# Patient Record
Sex: Female | Born: 1965 | Hispanic: Yes | Marital: Single
Health system: Midwestern US, Community
[De-identification: ages and names within clinical notes are randomized; demographics above are authoritative.]

## PROBLEM LIST (undated history)

## (undated) DIAGNOSIS — R011 Cardiac murmur, unspecified: Secondary | ICD-10-CM

## (undated) DIAGNOSIS — Z20822 Contact with and (suspected) exposure to covid-19: Secondary | ICD-10-CM

## (undated) DIAGNOSIS — I10 Essential (primary) hypertension: Secondary | ICD-10-CM

## (undated) DIAGNOSIS — Z86711 Personal history of pulmonary embolism: Secondary | ICD-10-CM

---

## 2012-03-15 NOTE — ED Provider Notes (Signed)
The history is provided by the patient.    9:59 PM Samantha Nixon is a 47 y.o. female with a h/o arthritis, who presents to ED c/o L knee pain, specifically behind the knee that has become progressively worse. Pt describes the pain as constant and states she feels a pop when she stands up. Pt states she took Aleve with minimal relief. Pt states she has felt this pain before last year. She states she was leaning on her L knee a few days ago that she believes might have exacerbated the pain. Pt reports she did fall 6 months ago but denies any other injuries, recent long travels, recent new medications, weakness, numbness, or any other complaints at this time. She saw an orthopedic surgeon for similar pain in this knee 2 years ago and was told that she has arthritis  Of the knee.  No fever, no redness of the knee.    PCP:  None        History reviewed. No pertinent past medical history.     History reviewed. No pertinent past surgical history.      History reviewed. No pertinent family history.     History     Social History   ??? Marital Status: SINGLE     Spouse Name: N/A     Number of Children: N/A   ??? Years of Education: N/A     Occupational History   ??? Not on file.     Social History Main Topics   ??? Smoking status: Never Smoker    ??? Smokeless tobacco: Not on file   ??? Alcohol Use: Not on file   ??? Drug Use: Not on file   ??? Sexually Active: Not on file     Other Topics Concern   ??? Not on file     Social History Narrative   ??? No narrative on file        ALLERGIES: Shellfish derived      Review of Systems   Constitutional: Negative for fever and chills.   HENT: Negative for neck pain and neck stiffness.    Respiratory: Negative for cough and shortness of breath.    Cardiovascular: Negative for chest pain and leg swelling.   Gastrointestinal: Negative for nausea and abdominal pain.   Genitourinary: Negative for dysuria and hematuria.   Musculoskeletal: Positive for arthralgias (L knee). Negative for myalgias and back pain.    Skin: Negative for color change and rash.   Neurological: Negative for light-headedness and headaches.       Filed Vitals:    03/15/12 2116 03/15/12 2133   BP: 150/72    Pulse: 65    Temp: 98.1 ??F (36.7 ??C)    Resp: 18    Height: 5\' 5"  (1.651 m)    Weight: 104.327 kg (230 lb)    SpO2: 100% 99%   9:59 PM Pulse Oximetry reading is 100 % on room air, which indicates normal oxygenation per Teola Bradley, MD          Physical Exam   Nursing note and vitals reviewed.  Constitutional: She is oriented to person, place, and time. She appears well-developed and well-nourished.   HENT:   Head: Normocephalic and atraumatic.   Eyes: Conjunctivae are normal. No scleral icterus.   Neck: Normal range of motion. Neck supple.   Cardiovascular: Normal rate, regular rhythm and normal heart sounds.    Pulmonary/Chest: Effort normal. No respiratory distress.   Abdominal: Soft. Bowel sounds are normal. She  exhibits no distension. There is no tenderness.   Musculoskeletal: Normal range of motion. She exhibits no edema.        Left knee: She exhibits swelling (mild). She exhibits normal range of motion ( can bend it 90 degrees) and no deformity. Tenderness ( diffuse, more so around the posterior ) found.   Neurological: She is alert and oriented to person, place, and time.   Skin: Skin is warm and dry.        MDM     Amount and/or Complexity of Data Reviewed:    Review and summarize past medical records:  Yes      Procedures     <EMERGENCY DEPARTMENT CASE SUMMARY>    Plan: Naproxen     ED Course: Pt presents to ED c/o L knee pain. Pt given Naproxen in the ED.     Re-evaluations:   10:20 PM Patient rechecked.  D/w pt test results, dx, tx plan, and need for follow-up. RTER warnings given. All questions answered, and the pt agrees with the plan.  RICE recommendations explained. ACE bandage applied. Rx: naproxen BID PRN.      Final Impression/Diagnosis:   1. Knee pain        Patient condition at time of disposition: stable      I have  reviewed the following home medications:    Prior to Admission medications    Medication Sig Start Date End Date Taking? Authorizing Provider   naproxen (NAPROSYN) 500 mg tablet Take 1 Tab by mouth every twelve (12) hours as needed for Pain. Take with food. 03/15/12  Yes Teola Bradley, MD       Teola Bradley, MD     I, Patience Musca, am serving as a scribe to document services personally performed by Teola Bradley, MD based on my observation and the provider's statements to me.      ITeola Bradley, MD attest that the person(s) noted above, acting as my scribe(s) noted above, has observed my performance of the services and has documented them in accordance with my direction. I have personally reviewed the above information and have ordered and reviewed the diagnostic studies, unless otherwise noted.

## 2012-03-15 NOTE — ED Notes (Signed)
Constant pain to back of left knee. Pt states when getting up, hears and feels a pop to the front of left knee. OTC meds not helping. Denies recent injury, fell six months ago.

## 2012-03-15 NOTE — ED Notes (Signed)
Patient presents to ED with complaints of left leg pain.  Pain behind knee.  Patient has taken aleve prior to arrival.  Had experience with this type of pain last year.  States diagnosed as arthritis in the past year.

## 2013-05-08 NOTE — Progress Notes (Signed)
05/08/2013     Allene DillonMeir Malmazada, MD   [address]  [place] [id number]        Patient:  Samantha MediciRosa Borunda   DOB:  1965-05-26       CHIEF COMPLAINT:   Chief Complaint   Patient presents with   ??? Shortness of Breath   ??? Palpitations   ??? Fatigue       Dear Dr. Marney DoctorMalmazada,    HISTORY OF PRESENT ILLNESS: Today I had the pleasure of seeing your patient, Samantha Nixon in cardiology consultation. As you know, she is a 48 y.o. year old female who  has no past medical history on file. presenting for cardiac evaluation. Several symptoms.    Intermittent palpitations x yrs. Happens about 2x/wk. Rapid/regular. Last minutes. Either resolve spontaneously or with a hard cough.    Significant generalized fatigue recently.    Isolated episode of CP 1 wk ago. Woke patient from sleep. Mid chest heaviness, nonradiating, lasted hours. Never happened before or since.    DOE mild and unchanged x yrs -- patient attributes to weight.     Intermittent numbness and parasthesias in fingers. New in the past few months. Occurs mainly at night while lying in bed. Improves in <1 min by "shaking out" her arms.    Husband has noted recent snoring; no apneic spells observed.    PAST MEDICAL HISTORY:  History reviewed. No pertinent past medical history.     PAST SURGICAL HISTORY:  Past Surgical History   Procedure Laterality Date   ??? Hx breast reduction     ??? Hx cholecystectomy         MEDICATIONS:  Current Outpatient Prescriptions   Medication Sig Dispense Refill   ??? cholecalciferol, vitamin D3, (VITAMIN D3) 2,000 unit tab Take  by mouth.       ??? FERROUS GLUCONATE/VIT C/FA (IRON-C PO) Take  by mouth.       ??? multivitamin (ONE A DAY) tablet Take 1 Tab by mouth daily.            ALLERGIES:   Allergies   Allergen Reactions   ??? Shellfish Derived Anaphylaxis       SOCIAL HISTORY:  History     Social History   ??? Marital Status: UNKNOWN     Spouse Name: N/A     Number of Children: N/A   ??? Years of Education: N/A     Social History Main Topics   ???  Smoking status: Never Smoker    ??? Smokeless tobacco: Not on file   ??? Alcohol Use: No   ??? Drug Use: No   ??? Sexual Activity: Not on file     Other Topics Concern   ??? Not on file     Social History Narrative        FAMILY HISTORY:  Family History   Problem Relation Age of Onset   ??? Hypertension Mother    ??? Heart Disease Father      MI in 4170's       REVIEW OF SYSTEMS:  Neuro: numbness, parasthesias  Eyes: No visual disturbance  ENMT: No soar throat, nasal congestion, hearing loss, tinnitus  CV: chest pain, palpitations   Resp: dyspnea  GI: No abdominal pain, N/V, diarrhea, constipation  GU: No hematuria, dysuria  Skin: No rash  Heme: No bleeding, bruising  Constitutional: fatigue  Musculoskeletal: No myalgias, arthralgias  Other:    PHYSICAL EXAM:  Blood pressure 150/94, pulse 72,  height 5\' 4"  (1.626 m), weight 248 lb (112.492 kg), SpO2 98 %.   Body mass index is 42.55 kg/(m^2).  General: Well developed, well nourished, no acute distress, appears stated age  Eyes: Anicteric, no hemorrhage, normal appearing conjunctivae and lids  ENMT: Normal externally  Neck: Supple, no JVD, no carotid bruit  CV: RRR, normal S1S2, no M/G/R  Lungs: CTA bilaterally, normal respiratory excursion  Abd: Soft, NT, ND, no palpable mass  Extremities: No pedal edema  Skin: Warm and dry  Musculoskeletal: Normal muscle tone, normal gait  Neuro: Nonfocal exam, no rigidity/tremor  Psych: Alert and oriented, appropriate affect    LABS AND TESTING:  ?? EKG: 03/25/13 - Sinus rhythm, normal axis/intervals, no significant ST/T abnormalities.   ?? Labs: 04/23/13 - CBC nl  ?? Labs 04/05/13 - CBC nl, CMP nl, TG 89, HDL 45, LDL 149, TSH nl      ASSESSMENT AND PLAN:    1. Chest pain, SOB - The described chest pain is atypical for a cardiac etiology, however, the patient has multiple cardiovascular risk factors. Her SOB also has a broad differential diagnosis (including obesity/deconditioning). Labs noted as above. Will check an echocardiogram to evaluate for any  structural heart disease and a stress test to evaluate for myocardial ischemia.   2. Palpitations - Etiology unclear. Labs noted. Will arrange for an echo and 24 hr holter monitor. If this is unrevealing, will likely need a longer term event monitor -- will followup.  3. Numbness, parasthesias - Doubt vascular anomaly -- patient with good pulses. Possibly due to peripheral neuropathy vs sleeping position. Observe for now and consider neuro eval if worsening.  4. Snoring, fatigue - Would arrange for sleep study. Will hold off for now until the above testing is complete.  5. Obesity - Has failed multiple diets and exercise regimens x yrs. I brought up the possibility of bariatric surgery. Pending the above planned test results, will give her a referral.        Thank you very much for this consultation, I will keep you updated with the results of the above testing.    Sincerely,    Cranford Mon, MD

## 2013-05-08 NOTE — Patient Instructions (Signed)
MyChart Activation    Thank you for requesting access to MyChart. Please follow the instructions below to securely access and download your online medical record. MyChart allows you to send messages to your doctor, view your test results, renew your prescriptions, schedule appointments, and more.    How Do I Sign Up?    1. In your internet browser, go to [email or web address]  2. Click on the First Time User? Click Here link in the Sign In box. You will be redirect to the New Member Sign Up page.  3. Enter your MyChart Access Code exactly as it appears below. You will not need to use this code after you???ve completed the sign-up process. If you do not sign up before the expiration date, you must request a new code.    MyChart Access Code: [email or web address]  Expires: 08/06/2013  1:48 PM (This is the date your MyChart access code will expire)    4. Enter the last four digits of your Social Security Number (xxxx) and Date of Birth (mm/dd/yyyy) as indicated and click Submit. You will be taken to the next sign-up page.  5. Create a MyChart ID. This will be your MyChart login ID and cannot be changed, so think of one that is secure and easy to remember.  6. Create a MyChart password. You can change your password at any time.  7. Enter your Password Reset Question and Answer. This can be used at a later time if you forget your password.   8. Enter your e-mail address. You will receive e-mail notification when new information is available in MyChart.  9. Click Sign Up. You can now view and download portions of your medical record.  10. Click the Download Summary menu link to download a portable copy of your medical information.    Additional Information    If you have questions, please visit the Frequently Asked Questions section of the MyChart website at [email or web address] Remember, MyChart is NOT to be used for urgent needs. For medical emergencies, dial 911.

## 2013-11-21 NOTE — Telephone Encounter (Signed)
You saw pt in 05/2013 and ordered some test pt booked and cancelled all now is calling to come back in do you want to see first or just reschedule all tests and see after ?

## 2013-12-05 ENCOUNTER — Encounter
Admit: 2013-12-05 | Discharge: 2013-12-05 | Payer: PRIVATE HEALTH INSURANCE | Attending: Cardiovascular Disease | Primary: Pulmonary Disease

## 2013-12-05 ENCOUNTER — Encounter: Attending: Internal Medicine | Primary: Pulmonary Disease

## 2013-12-05 ENCOUNTER — Institutional Professional Consult (permissible substitution): Admit: 2013-12-05 | Discharge: 2013-12-05 | Payer: PRIVATE HEALTH INSURANCE | Primary: Pulmonary Disease

## 2013-12-05 NOTE — Progress Notes (Unsigned)
The patient received  10.7  [facility]  At 11:00  Am and 33.8  [facility]  At 12:55  pm

## 2013-12-10 ENCOUNTER — Ambulatory Visit
Admit: 2013-12-10 | Discharge: 2013-12-10 | Payer: PRIVATE HEALTH INSURANCE | Attending: Cardiovascular Disease | Primary: Pulmonary Disease

## 2013-12-10 DIAGNOSIS — I10 Essential (primary) hypertension: Secondary | ICD-10-CM

## 2013-12-10 MED ORDER — HYDROCHLOROTHIAZIDE 25 MG TAB
25 mg | ORAL_TABLET | Freq: Every day | ORAL | Status: DC
Start: 2013-12-10 — End: 2016-09-11

## 2013-12-10 NOTE — Patient Instructions (Signed)
MyChart Activation    Thank you for requesting access to MyChart. Please follow the instructions below to securely access and download your online medical record. MyChart allows you to send messages to your doctor, view your test results, renew your prescriptions, schedule appointments, and more.    How Do I Sign Up?    1. In your internet browser, go to [email or web address]  2. Click on the First Time User? Click Here link in the Sign In box. You will be redirect to the New Member Sign Up page.  3. Enter your MyChart Access Code exactly as it appears below. You will not need to use this code after you???ve completed the sign-up process. If you do not sign up before the expiration date, you must request a new code.    MyChart Access Code: [email or web address]  Expires: 03/10/2014  3:42 PM (This is the date your MyChart access code will expire)    4. Enter the last four digits of your Social Security Number (xxxx) and Date of Birth (mm/dd/yyyy) as indicated and click Submit. You will be taken to the next sign-up page.  5. Create a MyChart ID. This will be your MyChart login ID and cannot be changed, so think of one that is secure and easy to remember.  6. Create a MyChart password. You can change your password at any time.  7. Enter your Password Reset Question and Answer. This can be used at a later time if you forget your password.   8. Enter your e-mail address. You will receive e-mail notification when new information is available in MyChart.  9. Click Sign Up. You can now view and download portions of your medical record.  10. Click the Download Summary menu link to download a portable copy of your medical information.    Additional Information    If you have questions, please visit the Frequently Asked Questions section of the MyChart website at [email or web address] Remember, MyChart is NOT to be used for urgent needs. For medical emergencies, dial 911.

## 2013-12-10 NOTE — Progress Notes (Signed)
12/10/2013     Patient:  Samantha Nixon   DOB:  1965/04/30       CHIEF COMPLAINT:   Chief Complaint   Patient presents with   ??? Results     nuc, 05/14/13- echo, 12/04/13-carotids, 12/04/13- holter       HISTORY OF PRESENT ILLNESS: Samantha Nixon presents for cardiology follow up. Very infrequent palpitations at this point -- moreso after caffeine intake. No further CP. DOE unchanged x yrs.     Continues to report snoring and daytime sleepiness.    PAST MEDICAL HISTORY:  History reviewed. No pertinent past medical history.     PAST SURGICAL HISTORY:  Past Surgical History   Procedure Laterality Date   ??? Hx breast reduction     ??? Hx cholecystectomy         MEDICATIONS:  Current Outpatient Prescriptions   Medication Sig Dispense Refill   ??? cholecalciferol, vitamin D3, (VITAMIN D3) 2,000 unit tab Take  by mouth.     ??? FERROUS GLUCONATE/VIT C/FA (IRON-C PO) Take  by mouth.     ??? multivitamin (ONE A DAY) tablet Take 1 Tab by mouth daily.          ALLERGIES:   Allergies   Allergen Reactions   ??? Shellfish Derived Anaphylaxis     Per pt. 12/10/13- no longer an allergy./rsh       SOCIAL HISTORY:  History     Social History   ??? Marital Status: UNKNOWN     Spouse Name: N/A     Number of Children: N/A   ??? Years of Education: N/A     Social History Main Topics   ??? Smoking status: Never Smoker    ??? Smokeless tobacco: Not on file   ??? Alcohol Use: No   ??? Drug Use: No   ??? Sexual Activity: Not on file     Other Topics Concern   ??? Not on file     Social History Narrative         FAMILY HISTORY:  Family History   Problem Relation Age of Onset   ??? Hypertension Mother    ??? Heart Disease Father      MI in 25's       REVIEW OF SYSTEMS:  Neuro: No headache, focal weakness/numbness, parasthesias  Eyes: No visual disturbance  ENMT: No soar throat, nasal congestion, hearing loss, tinnitus  CV: No chest pain, palpitations, dizziness, syncope, claudication  Resp: No dyspnea, cough  GI: No abdominal pain, N/V, diarrhea, constipation   GU: No hematuria, dysuria  Skin: No rash  Heme: No bleeding, bruising  Constitutional: fatigue  Musculoskeletal: No myalgias, arthralgias  Other:    PHYSICAL EXAM:  Blood pressure 130/90, pulse 66, height 5\' 4"  (1.626 m), weight 251 lb (113.853 kg), SpO2 99 %.   General: Well developed, well nourished, no acute distress, appears stated age  Eyes: Anicteric, no hemorrhage, normal appearing conjunctivae and lids  ENMT: Normal externally  Neck: Supple, no JVD, no carotid bruit  CV: RRR, normal S1S2, no M/G/R  Lungs: CTA bilaterally, normal respiratory excursion  Abd: Soft, NT, ND, no palpable mass  Extremities: No pedal edema  Skin: Warm and dry  Musculoskeletal: Normal muscle tone, normal gait  Neuro: Nonfocal exam, no rigidity/tremor  Psych: Alert and oriented, appropriate affect      LABS AND TESTING:  ?? Echo 05/14/13 - mild/mod LVH, nl LV/RV size/fxn, diast dysfxn, mild LAE, nl valves  ?? Carotid US 12/04/13 - nl  ??  24 hr holter 12/04/13 - NSR 48-114, avg 71, occasional PVC's, brief SVT x 1  ?? Nuclear 12/05/13 - Bruce x 9 min, nl LVEF, no ischemia/infarct    ASSESSMENT AND PLAN:    1. HTN - Blood pressure is elevated at today???s visit. Now elevated at 2 consecutive visits and LVH noted on echo. Start HCTZ 25. Short interval followup with labs prior.  2. Palpitations - Etiology unclear; possibly brief/infrequent SVT (although was asymptomatic during holter). Continue observation.  3. SOB - Suspect this is due to obesity, deconditioning, etc. No further cardiac testing planned.  4. Probable OSA - Pulm referral given.  5. Obesity - Long conversation re diet, exercise, and bariatric surgery. Referral given for bariatric surgery consultation [facility]).     Cranford MonAndrew Madora Barletta, MD

## 2014-03-10 ENCOUNTER — Encounter

## 2014-03-24 ENCOUNTER — Encounter: Attending: Cardiovascular Disease | Primary: Pulmonary Disease

## 2014-03-25 ENCOUNTER — Encounter: Payer: BLUE CROSS/BLUE SHIELD | Attending: Cardiovascular Disease | Primary: Pulmonary Disease

## 2014-03-25 ENCOUNTER — Encounter: Attending: Cardiovascular Disease | Primary: Pulmonary Disease

## 2014-04-07 ENCOUNTER — Encounter: Attending: Cardiovascular Disease | Primary: Pulmonary Disease

## 2016-09-11 ENCOUNTER — Inpatient Hospital Stay
Admit: 2016-09-11 | Discharge: 2016-09-12 | Disposition: A | Discharge status: Home / Self Care | Payer: MEDICAID | Attending: Emergency Medicine

## 2016-09-11 DIAGNOSIS — R51 Headache: Secondary | ICD-10-CM

## 2016-09-11 LAB — METABOLIC PANEL, BASIC
Anion gap: 8 mmol/L — ABNORMAL LOW (ref 10–20)
BUN: 14 mg/dL (ref 7–18)
CO2: 31 mmol/L (ref 21–32)
Calcium: 9.4 mg/dL (ref 8.5–10.1)
Chloride: 105 mmol/L (ref 98–107)
Creatinine: 0.58 mg/dL (ref 0.40–1.16)
GFR est AA: >60 mL/min/{1.73_m2} (ref >60)
GFR est non-AA: >60 mL/min/{1.73_m2} (ref >60)
Glucose: 104 mg/dL (ref 74–106)
Potassium: 3.8 mmol/L (ref 3.5–5.1)
Sodium: 140 mmol/L (ref 136–145)

## 2016-09-11 LAB — CBC WITH AUTOMATED DIFF
ABS. BASOPHILS: 0 10*3/uL (ref 0.0–0.1)
ABS. EOSINOPHILS: 0.1 10*3/uL (ref 0.0–0.7)
ABS. LYMPHOCYTES: 2 10*3/uL (ref 1.5–3.5)
ABS. MONOCYTES: 0.2 10*3/uL (ref 0.0–1.0)
ABS. NEUTROPHILS: 5.3 10*3/uL (ref 1.5–6.6)
BASOPHILS: 0 % (ref 0.0–3.0)
EOSINOPHILS: 2 % (ref 0.0–7.0)
HCT: 43.2 % (ref 36.0–47.0)
HGB: 13.7 g/dL (ref 12.0–16.0)
IMMATURE GRANULOCYTES: 0 % (ref 0.0–2.0)
LYMPHOCYTES: 26 % (ref 18.0–40.0)
MCH: 23.8 PG — ABNORMAL LOW (ref 27.0–35.0)
MCHC: 31.7 g/dL (ref 30.7–37.3)
MCV: 75 FL — ABNORMAL LOW (ref 81.0–94.0)
MONOCYTES: 3 % (ref 2.0–12.0)
MPV: 10.8 FL (ref 9.2–11.8)
NEUTROPHILS: 69 % (ref 48.0–72.0)
PLATELET: 256 10*3/uL (ref 130–400)
RBC: 5.76 M/uL — ABNORMAL HIGH (ref 4.20–5.40)
RDW: 14.5 % — ABNORMAL HIGH (ref 11.5–14.0)
WBC: 7.6 10*3/uL (ref 4.8–10.6)

## 2016-09-11 LAB — MAGNESIUM: Magnesium: 2.1 mg/dL (ref 1.6–2.6)

## 2016-09-11 MED ORDER — SODIUM CHLORIDE 0.9% BOLUS IV
0.9 % | Freq: Once | INTRAVENOUS | Status: AC
Start: 2016-09-11 — End: 2016-09-11
  Administered 2016-09-11: 23:00:00 via INTRAVENOUS

## 2016-09-11 MED ORDER — METOCLOPRAMIDE 5 MG/ML IJ SOLN
5 mg/mL | INTRAMUSCULAR | Status: AC
Start: 2016-09-11 — End: 2016-09-11
  Administered 2016-09-11: 23:00:00 via INTRAVENOUS

## 2016-09-11 MED FILL — SODIUM CHLORIDE 0.9 % IV: INTRAVENOUS | Qty: 1000

## 2016-09-11 MED FILL — METOCLOPRAMIDE 5 MG/ML IJ SOLN: 5 mg/mL | INTRAMUSCULAR | Qty: 2

## 2016-09-11 NOTE — ED Notes (Signed)
Pt. Cleared for d/c by ED MD. Pt. Verbalizes understanding of d/c instructions. PT. Left ED with steady gait, no distress noted.

## 2016-09-11 NOTE — ED Triage Notes (Signed)
Pt c/o blood in her stool since last night. Pt also c/o migraine.

## 2016-09-11 NOTE — ED Notes (Signed)
Report given and patient endorsed to Moses RN.

## 2016-09-11 NOTE — ED Provider Notes (Addendum)
Patient is a 51 y.o. female presenting with anal bleeding and migraines.   Rectal Bleeding    Pertinent negatives include no abdominal pain, no dysuria, no chills, no fever and no vomiting.   Migraine    Pertinent negatives include no fever, no palpitations, no shortness of breath and no vomiting.      [id number]  51 yo female with no significant hx, c/o rectal bleed and migraine headache.  She noticed blood in her stool last night and this morning. No pain, no vomiting, noml appetite  She developed a migraine headache this morning.  Pain to the right side of her head and behind the right eye.  +nausea, no vomiting.    He Bp was elevated but she says that her pressure always goes up and down.  She denies any medications for her Bp       History reviewed. No pertinent past medical history.    Past Surgical History:   Procedure Laterality Date   ??? HX BREAST REDUCTION     ??? HX CHOLECYSTECTOMY           Family History:   Problem Relation Age of Onset   ??? Hypertension Mother    ??? Heart Disease Father      MI in 8670's       Social History     Social History   ??? Marital status: UNKNOWN     Spouse name: N/A   ??? Number of children: N/A   ??? Years of education: N/A     Occupational History   ??? Not on file.     Social History Main Topics   ??? Smoking status: Never Smoker   ??? Smokeless tobacco: Not on file   ??? Alcohol use No   ??? Drug use: No   ??? Sexual activity: Not on file     Other Topics Concern   ??? Not on file     Social History Narrative         ALLERGIES: Shellfish derived    Review of Systems   Constitutional: Negative for chills and fever.   HENT: Negative.    Eyes: Negative.    Respiratory: Negative for shortness of breath.    Cardiovascular: Negative for chest pain and palpitations.   Gastrointestinal: Positive for anal bleeding. Negative for abdominal pain and vomiting.   Genitourinary: Negative for dysuria.   Musculoskeletal: Negative.    Neurological: Positive for headaches.   All other systems reviewed and are negative.       Vitals:    09/11/16 1752   BP: (!) 209/96   Pulse: 63   Resp: 17   Temp: 98.5 ??F (36.9 ??C)   SpO2: 99%   Weight: 102.5 kg (226 lb)   Height: 5\' 4"  (1.626 m)            Physical Exam   Constitutional: She is oriented to person, place, and time. She appears well-developed and well-nourished. No distress.   HENT:   Head: Normocephalic and atraumatic.   Mouth/Throat: Oropharynx is clear and moist.   Eyes: EOM are normal. Pupils are equal, round, and reactive to light.   Neck: Normal range of motion. Neck supple. No thyromegaly present.   Cardiovascular: Normal rate, regular rhythm and normal heart sounds.    Pulmonary/Chest: Effort normal and breath sounds normal. No respiratory distress.   Abdominal: Soft. Bowel sounds are normal. She exhibits no distension. There is no tenderness.   Genitourinary:   Genitourinary Comments: Rectal-  no external hemerrhoids, guaiac negative   Musculoskeletal: Normal range of motion. She exhibits no edema.   Neurological: She is alert and oriented to person, place, and time. No cranial nerve deficit.   Skin: Skin is warm and dry.   Psychiatric: She has a normal mood and affect.   Nursing note and vitals reviewed.       MDM  Number of Diagnoses or Management Options  Acute nonintractable headache, unspecified headache type:   Rectal bleeding:      Amount and/or Complexity of Data Reviewed  Clinical lab tests: ordered and reviewed          ED Course       Procedures    <EMERGENCY DEPARTMENT CASE SUMMARY>    Impression/Differential Diagnosis: rectal bleed, migraine      ED Course: IVF, Reglan, Labs    2000  Pt feeling better            Labs wnl,              Repeat Bp  185/85            Pt to be discharged to follow up with her PMD and GI doctor    Final Impression/Diagnosis: rectal bleed, headache    Patient condition at time of disposition: stable      I have reviewed the following home medications:    Prior to Admission medications     Medication Sig Start Date End Date Taking? Authorizing Provider   cholecalciferol, vitamin D3, (VITAMIN D3) 2,000 unit tab Take  by mouth.    Historical Provider   FERROUS GLUCONATE/VIT C/FA (IRON-C PO) Take  by mouth.    Historical Provider   multivitamin (ONE A DAY) tablet Take 1 Tab by mouth daily.    Historical Provider         Sherril Croon, PA         I was personally available for consultation in the emergency department.  I have reviewed the chart  recorded by the [facility], including the assessment, treatment plan, and disposition.  Teola Bradley, MD

## 2016-09-12 LAB — URINALYSIS W/ RFLX MICROSCOPIC
Bilirubin: NEGATIVE
Blood: NEGATIVE
Glucose: NEGATIVE mg/dL
Ketone: NEGATIVE mg/dL
Leukocyte Esterase: NEGATIVE
Nitrites: NEGATIVE
Protein: NEGATIVE mg/dL
Specific gravity: 1.014 (ref 1.003–1.030)
Urobilinogen: 1 EU/dL (ref 0.2–1.0)
pH (UA): 8 (ref 4.6–8.0)

## 2016-09-18 ENCOUNTER — Inpatient Hospital Stay
Admit: 2016-09-18 | Discharge: 2016-09-18 | Disposition: A | Discharge status: Home / Self Care | Payer: MEDICAID | Attending: Emergency Medicine

## 2016-09-18 DIAGNOSIS — G44209 Tension-type headache, unspecified, not intractable: Secondary | ICD-10-CM

## 2016-09-18 MED ORDER — CYCLOBENZAPRINE 10 MG TAB
10 mg | ORAL_TABLET | Freq: Three times a day (TID) | ORAL | 0 refills | Status: DC | PRN
Start: 2016-09-18 — End: 2017-06-26

## 2016-09-18 MED ORDER — ACETAMINOPHEN-CODEINE 300 MG-30 MG TAB
300-30 mg | ORAL_TABLET | ORAL | 0 refills | Status: DC | PRN
Start: 2016-09-18 — End: 2017-06-26

## 2016-09-18 NOTE — ED Notes (Signed)
I have reviewed discharge instructions with the patient.  The patient verbalized understanding.

## 2016-09-18 NOTE — ED Provider Notes (Signed)
The history is provided by the patient.     8:10 AM: Samantha Nixon is a 51 y.o. female with h/o migraines, who presents to the ED c/o bloody stool, and right neck pain. Patient reports that she was seen in ED last week for bloody stool, and has an appointment with her gastroenterologist in 4 days. Patient reports that she has been exercising, which may be exacerbating her neck pain. No other complaints at this time.    Dr. Janae BridgemanMay, Gastroenterologist    History reviewed. No pertinent past medical history.    Past Surgical History:   Procedure Laterality Date   ??? HX BREAST REDUCTION     ??? HX CHOLECYSTECTOMY       Family History:   Problem Relation Age of Onset   ??? Hypertension Mother    ??? Heart Disease Father      MI in 8970's     Social History     Social History   ??? Marital status: UNKNOWN     Spouse name: N/A   ??? Number of children: N/A   ??? Years of education: N/A     Occupational History   ??? Not on file.     Social History Main Topics   ??? Smoking status: Never Smoker   ??? Smokeless tobacco: Never Used   ??? Alcohol use No   ??? Drug use: No   ??? Sexual activity: Not on file     Other Topics Concern   ??? Not on file     Social History Narrative     ALLERGIES: Shellfish derived    Review of Systems   HENT: Negative for rhinorrhea and sore throat.    Eyes: Negative for photophobia and visual disturbance.   Cardiovascular: Negative for leg swelling.   Gastrointestinal: Positive for blood in stool.   Genitourinary: Negative for dysuria.   Musculoskeletal: Positive for neck pain (right). Negative for myalgias.   Skin: Negative for color change and pallor.   Neurological: Negative for seizures and syncope.     Vitals:    09/18/16 0803   BP: (!) 205/100   Pulse: 70   Resp: 16   Temp: 97.9 ??F (36.6 ??C)   SpO2: 100%   Weight: 102.5 kg (226 lb)   Height: 5\' 4"  (1.626 m)   8:03 AM Pulse Oximetry reading is 100 % on room air, which indicates normal oxygenation per Malachi Carlennis Dorell Gatlin, MD.     Physical Exam    Constitutional: She is oriented to person, place, and time. She appears well-developed and well-nourished. No distress.   HENT:   Head: Normocephalic and atraumatic.   Eyes: EOM are normal. Pupils are equal, round, and reactive to light.   Neck: Neck supple. No JVD present.   Cardiovascular: Normal rate, regular rhythm and normal heart sounds.    Pulmonary/Chest: Effort normal and breath sounds normal. No respiratory distress. She has no wheezes. She has no rales.   Abdominal: Soft. Bowel sounds are normal. She exhibits no distension. There is no tenderness. There is no rebound and no guarding.   Musculoskeletal: Normal range of motion. She exhibits no edema or tenderness.   Right neck tenderness.   Neurological: She is alert and oriented to person, place, and time.   Skin: Skin is warm and dry.   Psychiatric: She has a normal mood and affect.   Nursing note and vitals reviewed.     MDM  Number of Diagnoses or Management Options  Tension headache:  Amount and/or Complexity of Data Reviewed  Decide to obtain previous medical records or to obtain history from someone other than the patient: yes  Review and summarize past medical records: yes  Independent visualization of images, tracings, or specimens: yes      ED Course     Procedures  I, Malachi Carl, MD, reviewed the patient's past history, allergies and home medications as documented in the nursing chart.    <EMERGENCY DEPARTMENT CASE SUMMARY>    ED Course:   51 y.o. female presented to the ED c/o right neck pain, and bloody stool.     8:55 AM: Patient was reassessed prior to discharge. Patient???s symptoms Improved. I personally discussed discharge plan with patient/guardian, who understands instructions. All questions were answered.  Patient/guardian advised to follow up with PMD and/or specialist. Patient/guardian instructed to return to the ER if symptoms persist, change or worsen. Patient agrees with plan. Patient given Rx for Flexeril, and Tylenol-codeine.     Patient/guardian educated regarding the potential dangerous side effects of opiate medications, including drowsiness, dizziness, confusion, increased fall risk.  Pt also advised of the risk of addiction to opiate-based medications.  Patient/guardian advised that patient should not drive, operate machinery, stand above ground level (i.e. climb a ladder) for 24 hours after taking medication.  Patient/guardian verbalizes understanding.  iSTOP was not consulted because patient is receiving a prescription for less than a 5 day supply or 20 doses of medications as defined by [facility] Prescription Monitoring Program.    Final Impression/Diagnosis:   Encounter Diagnoses     ICD-10-CM ICD-9-CM   1. Tension headache G44.209 307.81   htn  Hx gi bleed  Patient condition at time of disposition: stable    I have reviewed the following home medications:    Prior to Admission medications    Medication Sig Start Date End Date Taking? Authorizing Provider   cholecalciferol, vitamin D3, (VITAMIN D3) 2,000 unit tab Take  by mouth.    Historical Provider   FERROUS GLUCONATE/VIT C/FA (IRON-C PO) Take  by mouth.    Historical Provider   multivitamin (ONE A DAY) tablet Take 1 Tab by mouth daily.    Historical Provider     Malachi Carl, MD      I, Dava Najjar, am serving as a scribe to document services personally performed by Malachi Carl, MD based on my observation and the provider's statements to me.    IMalachi Carl, MD, attest that the person(s) noted above, acting as my scribe(s) noted above, has observed my performance of the services and has documented them in accordance with my direction. I have personally reviewed the above information and have ordered and reviewed the diagnostic studies, unless otherwise noted.

## 2016-09-18 NOTE — ED Triage Notes (Signed)
Pt with complaint of migraine, blood in stool , blood in urine

## 2016-12-15 ENCOUNTER — Inpatient Hospital Stay
Admit: 2016-12-15 | Discharge: 2016-12-15 | Disposition: A | Discharge status: Home / Self Care | Payer: MEDICAID | Attending: Emergency Medicine

## 2016-12-15 DIAGNOSIS — R51 Headache: Secondary | ICD-10-CM

## 2016-12-15 MED ORDER — AMLODIPINE 10 MG TAB
10 mg | ORAL_TABLET | Freq: Every day | ORAL | 0 refills | Status: AC
Start: 2016-12-15 — End: 2017-01-04

## 2016-12-15 MED ORDER — CLONIDINE 0.1 MG TAB
0.1 mg | ORAL | Status: AC
Start: 2016-12-15 — End: 2016-12-15
  Administered 2016-12-15: 08:00:00 via ORAL

## 2016-12-15 MED ORDER — METOCLOPRAMIDE 5 MG/ML IJ SOLN
5 mg/mL | INTRAMUSCULAR | Status: AC
Start: 2016-12-15 — End: 2016-12-15
  Administered 2016-12-15: 06:00:00 via INTRAVENOUS

## 2016-12-15 MED ORDER — AMLODIPINE 5 MG TAB
5 mg | ORAL | Status: AC
Start: 2016-12-15 — End: 2016-12-15
  Administered 2016-12-15: 06:00:00 via ORAL

## 2016-12-15 MED ORDER — SODIUM CHLORIDE 0.9% BOLUS IV
0.9 % | Freq: Once | INTRAVENOUS | Status: AC
Start: 2016-12-15 — End: 2016-12-15
  Administered 2016-12-15: 06:00:00 via INTRAVENOUS

## 2016-12-15 MED FILL — AMLODIPINE 5 MG TAB: 5 mg | ORAL | Qty: 1

## 2016-12-15 MED FILL — METOCLOPRAMIDE 5 MG/ML IJ SOLN: 5 mg/mL | INTRAMUSCULAR | Qty: 2

## 2016-12-15 MED FILL — SODIUM CHLORIDE 0.9 % IV: INTRAVENOUS | Qty: 1000

## 2016-12-15 MED FILL — CLONIDINE 0.1 MG TAB: 0.1 mg | ORAL | Qty: 1

## 2016-12-15 NOTE — ED Triage Notes (Signed)
Patient had colonoscopy yesterday. After going out they told her that her BP is high and she will have headache

## 2016-12-15 NOTE — ED Provider Notes (Signed)
Patient is a 51 y.o. female presenting with headaches and hypertension.   Headache    The problem has been gradually worsening. Pertinent negatives include no fever, no palpitations, no shortness of breath and no vomiting.   Hypertension    Associated symptoms include headaches. Pertinent negatives include no chest pain, no palpitations, no shortness of breath and no vomiting.      17  51 yo female, no medical hx, c/o hight blood pressure and headache.  She went for a routine colonoscopy today and was found to be hypertensive.  She was given something to bring the pressure down.  She was told that she would have a headache.  She has + nausea and a little photophobia.  She has no hx of HTN, but + family hx.    History reviewed. No pertinent past medical history.    Past Surgical History:   Procedure Laterality Date   ??? HX BREAST REDUCTION     ??? HX CHOLECYSTECTOMY           Family History:   Problem Relation Age of Onset   ??? Hypertension Mother    ??? Heart Disease Father      MI in 40's       Social History     Social History   ??? Marital status: SINGLE     Spouse name: N/A   ??? Number of children: N/A   ??? Years of education: N/A     Occupational History   ??? Not on file.     Social History Main Topics   ??? Smoking status: Never Smoker   ??? Smokeless tobacco: Never Used   ??? Alcohol use No   ??? Drug use: No   ??? Sexual activity: Not on file     Other Topics Concern   ??? Not on file     Social History Narrative         ALLERGIES: Shellfish derived    Review of Systems   Constitutional: Negative for chills and fever.   HENT: Negative.    Respiratory: Negative for cough and shortness of breath.    Cardiovascular: Negative for chest pain and palpitations.   Gastrointestinal: Negative for abdominal pain and vomiting.   Genitourinary: Negative for dysuria.   Musculoskeletal: Negative.    Neurological: Positive for headaches.   All other systems reviewed and are negative.      Vitals:    12/15/16 0112   BP: (!) 188/95   Pulse: 99    Resp: 20   SpO2: 100%   Weight: 100.7 kg (222 lb)   Height:  (1.626 m)            Physical Exam   Constitutional: She is oriented to person, place, and time. She appears well-developed and well-nourished. No distress.   HENT:   Head: Normocephalic and atraumatic.   Eyes: EOM are normal. Pupils are equal, round, and reactive to light.   Neck: Normal range of motion. Neck supple.   Cardiovascular: Normal rate, regular rhythm and normal heart sounds.    Pulmonary/Chest: Effort normal and breath sounds normal. No respiratory distress.   Abdominal: Soft. Bowel sounds are normal. She exhibits no distension. There is no tenderness.   Musculoskeletal: Normal range of motion.   Neurological: She is alert and oriented to person, place, and time. No cranial nerve deficit.   Skin: Skin is dry.   Psychiatric: She has a normal mood and affect.   Nursing note and vitals  reviewed.       MDM      ED Course       Procedures    <EMERGENCY DEPARTMENT CASE SUMMARY>    Impression/Differential Diagnosis: headache, Elevated blood pressure      ED Course: IVF, Reglan,Norvasc    0330  Pt feeling better            BP has gotten better            Pt to be discharged to f/u with her pmd as planned later today    Final Impression/Diagnosis: headache, HTN    Patient condition at time of disposition: stable      I have reviewed the following home medications:    Prior to Admission medications    Medication Sig Start Date End Date Taking? Authorizing Provider   acetaminophen-codeine (TYLENOL-CODEINE #3) 300-30 mg per tablet Take 2 Tabs by mouth every four (4) hours as needed for Pain for up to 20 doses. Max Daily Amount: 12 Tabs. Max 4 per day 09/18/16   Malachi Carl, MD   cyclobenzaprine (FLEXERIL) 10 mg tablet Take 1 Tab by mouth three (3) times daily as needed for Muscle Spasm(s) for up to 20 doses. 09/18/16   Malachi Carl, MD   cholecalciferol, vitamin D3, (VITAMIN D3) 2,000 unit tab Take  by mouth.    Historical Provider    FERROUS GLUCONATE/VIT C/FA (IRON-C PO) Take  by mouth.    Historical Provider   multivitamin (ONE A DAY) tablet Take 1 Tab by mouth daily.    Historical Provider         Sherril Croon, PA

## 2016-12-15 NOTE — ED Notes (Signed)
Pt. Cleared for d/c by ED MD. Pt. Verbalizes understanding of d/c instructions. PT. Left ED with steady gait, no distress noted.

## 2017-06-26 ENCOUNTER — Ambulatory Visit
Admit: 2017-06-26 | Discharge: 2017-06-26 | Payer: BLUE CROSS/BLUE SHIELD | Attending: Cardiovascular Disease | Primary: Pulmonary Disease

## 2017-06-26 DIAGNOSIS — I10 Essential (primary) hypertension: Secondary | ICD-10-CM

## 2017-06-26 NOTE — Progress Notes (Signed)
06/26/2017     Patient:  Samantha Nixon Cornman   DOB:  [birth year]       CHIEF COMPLAINT:   Chief Complaint   Patient presents with   ??? Hypertension         HISTORY OF PRESENT ILLNESS: Samantha Nixon Yarbough presents for cardiology follow up. Last visit 3.5 yrs ago. Elevated BP noted by PCP. Mild DOE chronic, but exercise capacity is good. Intermittent palpitations -- infrequent, rapid/regular x 10 sec. Resolves with a cough. Similar x yrs. Moreso after caffeine. Denies CP, dizziness. Reports generalized fatigue. +Daytime sleepiness and snoring.    PAST MEDICAL HISTORY:  Past Medical History:   Diagnosis Date   ??? Hypertension         PAST SURGICAL HISTORY:  Past Surgical History:   Procedure Laterality Date   ??? HX BREAST REDUCTION     ??? HX CHOLECYSTECTOMY         MEDICATIONS:  Current Outpatient Medications   Medication Sig Dispense Refill   ??? amLODIPine (NORVASC) 5 mg tablet TAKE 2 TABLETS BY MOUTH EVERY DAY  3   ??? cholecalciferol, vitamin D3, (VITAMIN D3) 2,000 unit tab Take  by mouth.     ??? FERROUS GLUCONATE/VIT C/FA (IRON-C PO) Take  by mouth.     ??? multivitamin (ONE A DAY) tablet Take 1 Tab by mouth daily.          ALLERGIES:   Allergies   Allergen Reactions   ??? Shellfish Derived Anaphylaxis     Per pt. 12/10/13- no longer an allergy./rsh       SOCIAL HISTORY:  Social History     Socioeconomic History   ??? Marital status: SINGLE     Spouse name: Not on file   ??? Number of children: Not on file   ??? Years of education: Not on file   ??? Highest education level: Not on file   Tobacco Use   ??? Smoking status: Never Smoker   ??? Smokeless tobacco: Never Used   Substance and Sexual Activity   ??? Alcohol use: No   ??? Drug use: No         FAMILY HISTORY:  Family History   Problem Relation Age of Onset   ??? Hypertension Mother    ??? Heart Disease Father         MI in 2070's       REVIEW OF SYSTEMS:  Neuro: No headache, focal weakness/numbness, parasthesias  Eyes: No visual disturbance  ENMT: No soar throat, nasal congestion, hearing loss, tinnitus   CV: No chest pain, dizziness, syncope, claudication  Resp: dyspnea  GI: No abdominal pain, N/V, diarrhea, constipation  GU: No hematuria, dysuria  Skin: No rash  Heme: No bleeding, bruising  Constitutional: fatigue  Musculoskeletal: No myalgias, arthralgias  Other:    PHYSICAL EXAM:  Blood pressure 134/86, pulse 78, height 5\' 4"  (1.626 m), weight 245 lb (111.1 kg), SpO2 97 %.   Body mass index is 42.05 kg/m??.   General: Well developed, well nourished, no acute distress, appears stated age  Eyes: Anicteric, no hemorrhage, normal appearing conjunctivae and lids  ENMT: Normal externally  Neck: Supple, no JVD, no carotid bruit  CV: RRR, normal S1S2, 1/6 mid syst murmur LUSB  Lungs: CTA bilaterally, normal respiratory excursion  Abd: Soft, NT, ND, no palpable mass  Extremities: No pedal edema  Skin: Warm and dry  Musculoskeletal: Normal muscle tone, normal gait  Neuro: Nonfocal exam, no rigidity/tremor  Psych: Alert and oriented, appropriate  affect      LABS AND TESTING:  ?? EKG: NSR, lateral TWI/flattening (slightly more prominent, but similar to prior)  ?? Labs: 09/2016 - CBC nl (x MCV 75), BMP nl  ?? Echo 05/14/13 - mild/mod LVH, nl LV/RV size/fxn, diast dysfxn, mild LAE, nl valves  ?? Carotid US 12/04/13 - nl  ?? 24 hr holter 12/04/13 - NSR 48-114, avg 71, occasional PVC's, brief SVT x 1  ?? Nuclear 12/05/13 - Bruce x 9 min, nl LVEF, no ischemia/infarct    ASSESSMENT AND PLAN:    1. HTN - Blood pressure is mildly elevated at today???s visit. Continue amlodipine (takes 5 daily). Intensify diet/exercise.   2. Palpitations - Probably due to brief/infrequent SVT. Continue observation. If this worsens, will consider repeat holter/event monitor +/- meds +/- EP consult.  3. SOB - Suspect this is due to obesity, deconditioning, etc. Extensive workup negative in 2015. Will repeat echo.  4. Probable OSA - Referral given for sleep study.  5. Obesity - Long conversation re diet, exercise, and bariatric surgery.  Referral given previously for bariatric surgery consultation [facility]). Deferring at the moment.    Cranford Mon, MD

## 2017-07-07 ENCOUNTER — Encounter: Payer: BLUE CROSS/BLUE SHIELD | Primary: Pulmonary Disease

## 2017-09-25 ENCOUNTER — Encounter: Payer: BLUE CROSS/BLUE SHIELD | Attending: Cardiovascular Disease | Primary: Pulmonary Disease

## 2017-10-08 ENCOUNTER — Inpatient Hospital Stay: Admit: 2017-10-08 | Payer: BLUE CROSS/BLUE SHIELD | Primary: Pulmonary Disease

## 2017-10-08 ENCOUNTER — Inpatient Hospital Stay
Admit: 2017-10-08 | Discharge: 2017-10-08 | Disposition: A | Discharge status: Home / Self Care | Payer: BLUE CROSS/BLUE SHIELD | Attending: Emergency Medicine

## 2017-10-08 DIAGNOSIS — R1031 Right lower quadrant pain: Secondary | ICD-10-CM

## 2017-10-08 LAB — CBC WITH AUTO DIFFERENTIAL
Basophils %: 1 % (ref 0.0–3.0)
Basophils Absolute: 0 10*3/uL (ref 0.0–0.1)
Eosinophils %: 4 % (ref 0.0–7.0)
Eosinophils Absolute: 0.2 10*3/uL (ref 0.0–0.7)
Hematocrit: 37.8 % (ref 36.0–47.0)
Hemoglobin: 12.2 g/dL (ref 12.0–16.0)
Immature Granulocytes: 1 % (ref 0.0–2.0)
Lymphocytes %: 44 % — ABNORMAL HIGH (ref 18.0–40.0)
Lymphocytes Absolute: 2.4 10*3/uL (ref 1.5–3.5)
MCH: 23.6 PG — ABNORMAL LOW (ref 27.0–35.0)
MCHC: 32.3 g/dL (ref 30.7–37.3)
MCV: 73.1 FL — ABNORMAL LOW (ref 81.0–94.0)
MPV: 11.6 FL (ref 9.2–11.8)
Monocytes %: 7 % (ref 2.0–12.0)
Monocytes Absolute: 0.4 10*3/uL (ref 0.0–1.0)
Neutrophils %: 44 % — ABNORMAL LOW (ref 48.0–72.0)
Neutrophils Absolute: 2.4 10*3/uL (ref 1.5–6.6)
Platelet Estimate: ADEQUATE
Platelets: 219 10*3/uL (ref 130–400)
RBC: 5.17 M/uL (ref 4.20–5.40)
RDW: 14.7 % — ABNORMAL HIGH (ref 11.5–14.0)
WBC: 5.5 10*3/uL (ref 4.8–10.6)

## 2017-10-08 LAB — COMPREHENSIVE METABOLIC PANEL
ALT: 30 U/L (ref 13–61)
AST: 22 U/L (ref 15–37)
Albumin/Globulin Ratio: 1 (ref 0.7–2.8)
Albumin: 3.7 g/dL (ref 3.5–4.7)
Alkaline Phosphatase: 97 U/L (ref 45–117)
Anion Gap: 12 mmol/L (ref 10–20)
BUN: 19 mg/dL — ABNORMAL HIGH (ref 7–18)
CO2: 25 mmol/L (ref 21–32)
Calcium: 8.9 mg/dL (ref 8.5–10.1)
Chloride: 111 mmol/L — ABNORMAL HIGH (ref 98–107)
Creatinine: 0.64 mg/dL (ref 0.40–1.16)
EGFR IF NonAfrican American: >60 mL/min/{1.73_m2} (ref >60)
GFR African American: >60 mL/min/{1.73_m2} (ref >60)
Globulin: 3.6 g/dL (ref 1.7–4.7)
Glucose: 93 mg/dL (ref 74–106)
Potassium: 3.9 mmol/L (ref 3.5–5.1)
Sodium: 144 mmol/L (ref 136–145)
Total Bilirubin: 0.4 mg/dL (ref 0.2–1.0)
Total Protein: 7.3 g/dL (ref 6.4–8.2)

## 2017-10-08 LAB — URINALYSIS W/ RFLX MICROSCOPIC
Bilirubin, Urine: NEGATIVE
Bilirubin: NEGATIVE
Blood, Urine: NEGATIVE
Blood: NEGATIVE
Glucose, Ur: NEGATIVE mg/dL
Glucose: NEGATIVE mg/dL
Ketone: NEGATIVE mg/dL
Ketones, Urine: NEGATIVE mg/dL
Leukocyte Esterase, Urine: NEGATIVE
Leukocyte Esterase: NEGATIVE
Nitrite, Urine: NEGATIVE
Nitrites: NEGATIVE
Protein, UA: NEGATIVE mg/dL
Protein: NEGATIVE mg/dL
Specific Gravity, UA: 1.017 (ref 1.003–1.030)
Specific gravity: 1.017 (ref 1.003–1.030)
Urobilinogen, UA, POCT: 1 EU/dL (ref 0.2–1.0)
Urobilinogen: 1 EU/dL (ref 0.2–1.0)
pH (UA): 7 (ref 4.6–8.0)
pH, UA: 7 (ref 4.6–8.0)

## 2017-10-08 LAB — LIPASE
Lipase: 117 U/L (ref 73–393)
Lipase: 117 U/L (ref 73–393)

## 2017-10-08 LAB — CBC WITH AUTOMATED DIFF
ABS. BASOPHILS: 0 10*3/uL (ref 0.0–0.1)
ABS. EOSINOPHILS: 0.2 10*3/uL (ref 0.0–0.7)
ABS. LYMPHOCYTES: 2.4 10*3/uL (ref 1.5–3.5)
ABS. MONOCYTES: 0.4 10*3/uL (ref 0.0–1.0)
ABS. NEUTROPHILS: 2.4 10*3/uL (ref 1.5–6.6)
BASOPHILS: 1 % (ref 0.0–3.0)
EOSINOPHILS: 4 % (ref 0.0–7.0)
HCT: 37.8 % (ref 36.0–47.0)
HGB: 12.2 g/dL (ref 12.0–16.0)
IMMATURE GRANULOCYTES: 1 % (ref 0.0–2.0)
LYMPHOCYTES: 44 % — ABNORMAL HIGH (ref 18.0–40.0)
MCH: 23.6 PG — ABNORMAL LOW (ref 27.0–35.0)
MCHC: 32.3 g/dL (ref 30.7–37.3)
MCV: 73.1 FL — ABNORMAL LOW (ref 81.0–94.0)
MONOCYTES: 7 % (ref 2.0–12.0)
MPV: 11.6 FL (ref 9.2–11.8)
NEUTROPHILS: 44 % — ABNORMAL LOW (ref 48.0–72.0)
PLATELET ESTIMATE: ADEQUATE
PLATELET: 219 10*3/uL (ref 130–400)
RBC: 5.17 M/uL (ref 4.20–5.40)
RDW: 14.7 % — ABNORMAL HIGH (ref 11.5–14.0)
WBC: 5.5 10*3/uL (ref 4.8–10.6)

## 2017-10-08 LAB — METABOLIC PANEL, COMPREHENSIVE
A-G Ratio: 1 (ref 0.7–2.8)
ALT (SGPT): 30 U/L (ref 13–61)
AST (SGOT): 22 U/L (ref 15–37)
Albumin: 3.7 g/dL (ref 3.5–4.7)
Alk. phosphatase: 97 U/L (ref 45–117)
Anion gap: 12 mmol/L (ref 10–20)
BUN: 19 mg/dL — ABNORMAL HIGH (ref 7–18)
Bilirubin, total: 0.4 mg/dL (ref 0.2–1.0)
CO2: 25 mmol/L (ref 21–32)
Calcium: 8.9 mg/dL (ref 8.5–10.1)
Chloride: 111 mmol/L — ABNORMAL HIGH (ref 98–107)
Creatinine: 0.64 mg/dL (ref 0.40–1.16)
GFR est AA: >60 mL/min/{1.73_m2} (ref >60)
GFR est non-AA: >60 mL/min/{1.73_m2} (ref >60)
Globulin: 3.6 g/dL (ref 1.7–4.7)
Glucose: 93 mg/dL (ref 74–106)
Potassium: 3.9 mmol/L (ref 3.5–5.1)
Protein, total: 7.3 g/dL (ref 6.4–8.2)
Sodium: 144 mmol/L (ref 136–145)

## 2017-10-08 MED ORDER — SODIUM CHLORIDE 0.9% BOLUS IV
0.9 % | Freq: Once | INTRAVENOUS | Status: AC
Start: 2017-10-08 — End: 2017-10-08
  Administered 2017-10-08: 10:00:00 via INTRAVENOUS

## 2017-10-08 MED ORDER — MELOXICAM 15 MG TAB
15 mg | ORAL_TABLET | Freq: Every day | ORAL | 0 refills | Status: DC
Start: 2017-10-08 — End: 2019-03-25

## 2017-10-08 MED ORDER — DIATRIZOATE MEGLUMINE & SODIUM 66 %-10 % ORAL SOLN
66-10 % | Freq: Once | ORAL | Status: AC
Start: 2017-10-08 — End: 2017-10-08
  Administered 2017-10-08: 10:00:00 via ORAL

## 2017-10-08 MED ORDER — SODIUM CHLORIDE 0.9 % IV
INTRAVENOUS | Status: DC
Start: 2017-10-08 — End: 2017-10-08
  Administered 2017-10-08 (×2): via INTRAVENOUS

## 2017-10-08 MED ORDER — IOPAMIDOL 61 % IV SOLN
300 mg iodine /mL (61 %) | Freq: Once | INTRAVENOUS | Status: AC
Start: 2017-10-08 — End: 2017-10-08
  Administered 2017-10-08: 13:00:00 via INTRAVENOUS

## 2017-10-08 MED ORDER — CYCLOBENZAPRINE 10 MG TAB
10 mg | ORAL_TABLET | Freq: Three times a day (TID) | ORAL | 0 refills | Status: DC | PRN
Start: 2017-10-08 — End: 2019-03-25

## 2017-10-08 MED FILL — GASTROGRAFIN 66 %-10 % ORAL SOLUTION: 66-10 % | ORAL | Qty: 30

## 2017-10-08 MED FILL — SODIUM CHLORIDE 0.9 % IV: INTRAVENOUS | Qty: 1000

## 2017-10-08 MED FILL — ISOVUE-300  61 % INTRAVENOUS SOLUTION: 300 mg iodine /mL (61 %) | INTRAVENOUS | Qty: 100

## 2017-10-08 NOTE — ED Notes (Signed)
Verbal report received from ED RN Rachelle.

## 2017-10-08 NOTE — ED Notes (Signed)
Pt care signed over to me from Dr. Talmadge Coventry at 700 pending CT, reassessment, and disposition.    Labs:  Recent Results (from the past 12 hour(s))   URINALYSIS W/ RFLX MICROSCOPIC    Collection Time: 10/08/17  5:05 AM   Result Value Ref Range    Color YELLOW YEL      Appearance CLEAR CLEAR      Specific gravity 1.017 1.003 - 1.030      pH (UA) 7.0 4.6 - 8.0      Protein NEGATIVE  NEG mg/dL    Glucose NEGATIVE  NEG mg/dL    Ketone NEGATIVE  NEG mg/dL    Bilirubin NEGATIVE  NEG      Blood NEGATIVE  NEG      Urobilinogen 1.0 0.2 - 1.0 EU/dL    Nitrites NEGATIVE  NEG      Leukocyte Esterase NEGATIVE  NEG     METABOLIC PANEL, COMPREHENSIVE    Collection Time: 10/08/17  5:44 AM   Result Value Ref Range    Sodium 144 136 - 145 mmol/L    Potassium 3.9 3.5 - 5.1 mmol/L    Chloride 111 (H) 98 - 107 mmol/L    CO2 25 21 - 32 mmol/L    Anion gap 12 10 - 20 mmol/L    Glucose 93 74 - 106 mg/dL    BUN 19 (H) 7 - 18 mg/dL    Creatinine 0.64 0.40 - 1.16 mg/dL    GFR est AA >60 >60 ml/min/1.70m    GFR est non-AA >60 >60 ml/min/1.771m   Calcium 8.9 8.5 - 10.1 mg/dL    Bilirubin, total 0.4 0.2 - 1.0 mg/dL    ALT (SGPT) 30 13 - 61 U/L    AST (SGOT) 22 15 - 37 U/L    Alk. phosphatase 97 45 - 117 U/L    Protein, total 7.3 6.4 - 8.2 g/dL    Albumin 3.7 3.5 - 4.7 g/dL    Globulin 3.6 1.7 - 4.7 g/dL    A-G Ratio 1.0 0.7 - 2.8     LIPASE    Collection Time: 10/08/17  5:44 AM   Result Value Ref Range    Lipase 117 73 - 393 U/L   CBC WITH AUTOMATED DIFF    Collection Time: 10/08/17  5:44 AM   Result Value Ref Range    WBC 5.5 4.8 - 10.6 K/uL    RBC 5.17 4.20 - 5.40 M/uL    HGB 12.2 12.0 - 16.0 g/dL    HCT 37.8 36.0 - 47.0 %    MCV 73.1 (L) 81.0 - 94.0 FL    MCH 23.6 (L) 27.0 - 35.0 PG    MCHC 32.3 30.7 - 37.3 g/dL    RDW 14.7 (H) 11.5 - 14.0 %    PLATELET 219 130 - 400 K/uL    MPV 11.6 9.2 - 11.8 FL    NEUTROPHILS 44 (L) 48.0 - 72.0 %    LYMPHOCYTES 44 (H) 18.0 - 40.0 %    MONOCYTES 7 2.0 - 12.0 %    EOSINOPHILS 4 0.0 - 7.0 %     BASOPHILS 1 0.0 - 3.0 %    ABS. NEUTROPHILS 2.4 1.5 - 6.6 K/UL    ABS. LYMPHOCYTES 2.4 1.5 - 3.5 K/UL    ABS. MONOCYTES 0.4 0.0 - 1.0 K/UL    ABS. EOSINOPHILS 0.2 0.0 - 0.7 K/UL    ABS. BASOPHILS 0.0 0.0 - 0.1 K/UL  DF AUTOMATED      IMMATURE GRANULOCYTES 1 0.0 - 2.0 %    RBC COMMENTS 1+  MICROCYTES       RBC COMMENTS 1+  HYPOCHROMIA       PLATELET ESTIMATE ADEQUATE         Radiology:    CT Results  (Last 48 hours)               10/08/17 0847  CT ABD PELV W CONT Final result    Impression:  IMPRESSION: No evidence of acute pathology. Cholecystectomy. No change of liver   and splenic cysts. No change of left renal calculus. Sigmoid diverticulosis.           Narrative:  CT OF THE ABDOMEN AND PELVIS       CT of the abdomen and pelvis was performed with oral and intravenous contrast.    Isovue-300 was injected. Images were obtained from the lung bases to the pubic   symphysis. Axial, coronal and sagittal images were also obtained. Utilizing   manufacturer algorithms, the examination was performed to optimize imaging   qualities while utilizing the lowest possible radiation dose.       History: Right lower quadrant pain radiating to the right flank for 3 months.   Cholecystectomy and hypertension..       Comparison: Noncontrast CT colonoscopy dated 03/05/2007.       LUNG BASES: Normal.   LIVER: There has been no change of a 1.6 cm probable cyst in the right lobe.   There is no evidence of a mass or extrahepatic biliary dilatation. There is some   intrahepatic biliary dilatation probably due to the cholecystectomy.   SPLEEN: There has been no change of a 3.9 and a 1.9 cm probable cyst in the   anterior spleen which is otherwise normal.   PANCREAS: Normal. There is no evidence of a mass, pancreatitis or pseudocyst.   ADRENAL GLANDS: Normal.   KIDNEYS: The right kidney is normal. There is a 3 mm calculus in the anterior   left left kidney which is unchanged. There is no evidence of  other calculi or    hydronephrosis. There is a calcified phlebolith in the right pelvis which is not   new. Calculi can be obscured by intravenous contrast.   AORTA: There is no evidence of aortic aneurysm. There is some calcified   atherosclerosis.   GALLBLADDER: There is a cholecystectomy. There is no gross evidence of biliary   gallstones. Ultrasound is suggested if clinically warranted.   APPENDIX: The appendix is not definitively visualized but there are no secondary   signs of appendicitis such as an appendicolith, perforation, abscess or   mesenteric infiltration/induration.Marland Kitchen    LYMPHADENOPATHY: There is no evidence of lymphadenopathy.   BOWEL: There is some distention of the sigmoid colon. There is sigmoid   diverticulosis. The remaining bowel appears to be grossly normal. There is no   evidence of colitis/diverticulitis, bowel- containing hernia, perforation or   abscess.        The urinary bladder is not opacified. It is grossly normal. Calcified   phleboliths noted.       Miscellaneous: The uterus and adnexa appear to be grossly normal.              Radiology interpretation reviewed by Lorenda Peck, MD      ED Course:   Reassessed the patient who is feeling better and wants to go home.     10:32 AM:  Patient was reassessed prior to discharge. Patient???s symptoms Resolved. I personally discussed discharge plan with patient, who understands instructions. All questions were answered.  Patient advised to follow up with PMD and/or specialist. Patient instructed to return to the ER if symptoms persist, change or worsen. Patient agrees with plan.     Final Diagnosis:   1. Abdominal pain, right lower quadrant         Status at Disposition: stable         Lorenda Peck, MD    I, Joylene Draft, am serving as a scribe to document services personally performed by Lorenda Peck, MD  based on my observation and the provider's statements to me.    I attest the person(s) noted above, acting as my scribe(s), has/have  observed my performance of the services and has documented them in accordance with my direction. I have personally reviewed the above information and have ordered and reviewed the diagnostic studies, unless otherwise noted.    Lorenda Peck, MD

## 2017-10-08 NOTE — ED Notes (Signed)
Patient discharged home; verbalized understanding of instructions and follow up care. Ambulated out of ED with a steady gait.

## 2017-10-08 NOTE — ED Triage Notes (Signed)
C/o RLQ pain radiating to back x 2 months

## 2017-10-08 NOTE — ED Notes (Signed)
Pt was signed out for Ct abdomen/ pelvis ,not completed yet  . Signed out to Dr C. Gabriel

## 2017-10-08 NOTE — ED Provider Notes (Addendum)
HPI     52 yo female with history of HTN presents to the ED with complaints of right lower quadrant pain radiates toward the right flank x 2-3 months, worse at night, better as the day progresses. No N/ V/ D/ constipation. She also denies any urinary complaints. She has had coloscopy ( Dr Derrill KayGoodman) and has been evaluated by her gynecologist with no diagnosis. Pt says her PCP had said he would order a CT scan of the abdomen but at this point hasn't. Pain is nagging, she is coming to the ED requests CT scan.     Past Medical History:   Diagnosis Date   ??? Hypertension        Past Surgical History:   Procedure Laterality Date   ??? HX BREAST REDUCTION     ??? HX CHOLECYSTECTOMY           Family History:   Problem Relation Age of Onset   ??? Hypertension Mother    ??? Heart Disease Father         MI in [id number]'s       Social History     Socioeconomic History   ??? Marital status: SINGLE     Spouse name: Not on file   ??? Number of children: Not on file   ??? Years of education: Not on file   ??? Highest education level: Not on file   Occupational History   ??? Not on file   Social Needs   ??? Financial resource strain: Not on file   ??? Food insecurity:     Worry: Not on file     Inability: Not on file   ??? Transportation needs:     Medical: Not on file     Non-medical: Not on file   Tobacco Use   ??? Smoking status: Never Smoker   ??? Smokeless tobacco: Never Used   Substance and Sexual Activity   ??? Alcohol use: No   ??? Drug use: No   ??? Sexual activity: Not on file   Lifestyle   ??? Physical activity:     Days per week: Not on file     Minutes per session: Not on file   ??? Stress: Not on file   Relationships   ??? Social connections:     Talks on phone: Not on file     Gets together: Not on file     Attends religious service: Not on file     Active member of club or organization: Not on file     Attends meetings of clubs or organizations: Not on file     Relationship status: Not on file   ??? Intimate partner violence:      Fear of current or ex partner: Not on file     Emotionally abused: Not on file     Physically abused: Not on file     Forced sexual activity: Not on file   Other Topics Concern   ??? Not on file   Social History Narrative   ??? Not on file         ALLERGIES: Shellfish derived    Review of Systems   Constitutional: Negative for appetite change, fatigue and fever.   Respiratory: Negative for shortness of breath.    Cardiovascular: Negative for chest pain.   Gastrointestinal: Positive for abdominal pain. Negative for constipation, diarrhea, nausea and vomiting.   Genitourinary: Negative for difficulty urinating, dysuria, flank pain, menstrual problem, vaginal bleeding, vaginal discharge and  vaginal pain.   Musculoskeletal: Negative for back pain.   All other systems reviewed and are negative.      Vitals:    10/08/17 0450 10/08/17 0452   BP: 157/85    Pulse: 67    Resp: 16    Temp: 98.1 ??F (36.7 ??C)    SpO2:  95%   Weight: 108.9 kg (240 lb)    Height: 5\' 4"  (1.626 m)             Physical Exam   Constitutional: She is oriented to person, place, and time. She appears well-developed and well-nourished. No distress.   HENT:   Head: Normocephalic and atraumatic.   Mouth/Throat: Oropharynx is clear and moist.   Eyes: Pupils are equal, round, and reactive to light. Conjunctivae and EOM are normal. No scleral icterus.   Neck: Normal range of motion. Neck supple.   Cardiovascular: Normal rate, regular rhythm and intact distal pulses.   Pulmonary/Chest: Effort normal and breath sounds normal. No respiratory distress.   Abdominal: Soft. Bowel sounds are normal. She exhibits no distension. There is no tenderness. There is no rigidity, no rebound, no guarding, no CVA tenderness, no tenderness at McBurney's point and negative Murphy's sign.   Musculoskeletal: Normal range of motion.   Neurological: She is alert and oriented to person, place, and time.   Skin: Skin is warm and dry.    Psychiatric: She has a normal mood and affect. Her behavior is normal.   Nursing note and vitals reviewed.       MDM  Number of Diagnoses or Management Options     Amount and/or Complexity of Data Reviewed  Clinical lab tests: ordered  Tests in the radiology section of CPT??: ordered  Review and summarize past medical records: yes           Procedures    I, Levonne Hubert, reviewed the patient's past history, allergies and home medications as documented in the nursing chart.    I have reviewed the following home medications:    Prior to Admission medications    Medication Sig Start Date End Date Taking? Authorizing Provider   amLODIPine (NORVASC) 5 mg tablet TAKE 2 TABLETS BY MOUTH EVERY DAY 05/19/17   Provider, Historical       Labs:  No results found for this or any previous visit (from the past 96 hour(s)).      Radiology:    No results found.      Differential Diagnosis:  Differential Diagnosis includes but is not limited to:   Hernia  Musculoskeletal pain   Colitis   Kidney stone       ED Course:   52 yo female presents to the ED with complaints of nagging right lower quadrant pain radiates to the right flank x 2-3 months. No associated symptoms of N/ V/ D/ Dysuria.   She has been evaluated by GI and GYN since pain began. She has also been evaluated by her PCP with no diagnosis. She is requesting a CT scan now.   CBC, CMP, Lipase, UA, CT scan with contrast ordered.  Patient to be endorsed to Dr Ruel Favors, ED attending for continuation of care. Workup is pending.     Final Impression/Diagnosis:   Encounter Diagnoses     ICD-10-CM ICD-9-CM   1. Abdominal pain, right lower quadrant R10.31 789.03       Patient condition at time of disposition: TBD         I, Ashley Mariner  that all of the information above was obtained by myself under the supervision of Walaa Carel, Genevie Ann, MD. I have ordered and reviewed the diagnostic studies, unless otherwise noted.     Levonne Hubert MSN, FNP-BC, ENP-C          I was personally available for consultation in the emergency department.  I have reviewed the chart and agree with the documentation recorded by the [facility], including the assessment, treatment plan, and disposition.  Earnie Larsson, MD

## 2017-10-08 NOTE — ED Notes (Signed)
Pt care signed over to me from Dr. Talmadge Coventry at 700 pending CT, reassessment, and disposition.    Labs:  Recent Results (from the past 12 hour(s))   URINALYSIS W/ RFLX MICROSCOPIC    Collection Time: 10/08/17  5:05 AM   Result Value Ref Range    Color YELLOW YEL      Appearance CLEAR CLEAR      Specific gravity 1.017 1.003 - 1.030      pH (UA) 7.0 4.6 - 8.0      Protein NEGATIVE  NEG mg/dL    Glucose NEGATIVE  NEG mg/dL    Ketone NEGATIVE  NEG mg/dL    Bilirubin NEGATIVE  NEG      Blood NEGATIVE  NEG      Urobilinogen 1.0 0.2 - 1.0 EU/dL    Nitrites NEGATIVE  NEG      Leukocyte Esterase NEGATIVE  NEG     METABOLIC PANEL, COMPREHENSIVE    Collection Time: 10/08/17  5:44 AM   Result Value Ref Range    Sodium 144 136 - 145 mmol/L    Potassium 3.9 3.5 - 5.1 mmol/L    Chloride 111 (H) 98 - 107 mmol/L    CO2 25 21 - 32 mmol/L    Anion gap 12 10 - 20 mmol/L    Glucose 93 74 - 106 mg/dL    BUN 19 (H) 7 - 18 mg/dL    Creatinine 0.64 0.40 - 1.16 mg/dL    GFR est AA >60 >60 ml/min/1.54m    GFR est non-AA >60 >60 ml/min/1.7103m   Calcium 8.9 8.5 - 10.1 mg/dL    Bilirubin, total 0.4 0.2 - 1.0 mg/dL    ALT (SGPT) 30 13 - 61 U/L    AST (SGOT) 22 15 - 37 U/L    Alk. phosphatase 97 45 - 117 U/L    Protein, total 7.3 6.4 - 8.2 g/dL    Albumin 3.7 3.5 - 4.7 g/dL    Globulin 3.6 1.7 - 4.7 g/dL    A-G Ratio 1.0 0.7 - 2.8     LIPASE    Collection Time: 10/08/17  5:44 AM   Result Value Ref Range    Lipase 117 73 - 393 U/L   CBC WITH AUTOMATED DIFF    Collection Time: 10/08/17  5:44 AM   Result Value Ref Range    WBC 5.5 4.8 - 10.6 K/uL    RBC 5.17 4.20 - 5.40 M/uL    HGB 12.2 12.0 - 16.0 g/dL    HCT 37.8 36.0 - 47.0 %    MCV 73.1 (L) 81.0 - 94.0 FL    MCH 23.6 (L) 27.0 - 35.0 PG    MCHC 32.3 30.7 - 37.3 g/dL    RDW 14.7 (H) 11.5 - 14.0 %    PLATELET 219 130 - 400 K/uL    MPV 11.6 9.2 - 11.8 FL    NEUTROPHILS 44 (L) 48.0 - 72.0 %    LYMPHOCYTES 44 (H) 18.0 - 40.0 %    MONOCYTES 7 2.0 - 12.0 %    EOSINOPHILS 4 0.0 - 7.0 %    BASOPHILS 1 0.0 -  3.0 %    ABS. NEUTROPHILS 2.4 1.5 - 6.6 K/UL    ABS. LYMPHOCYTES 2.4 1.5 - 3.5 K/UL    ABS. MONOCYTES 0.4 0.0 - 1.0 K/UL    ABS. EOSINOPHILS 0.2 0.0 - 0.7 K/UL    ABS. BASOPHILS 0.0 0.0 - 0.1 K/UL  DF AUTOMATED      IMMATURE GRANULOCYTES 1 0.0 - 2.0 %    RBC COMMENTS 1+  MICROCYTES       RBC COMMENTS 1+  HYPOCHROMIA       PLATELET ESTIMATE ADEQUATE         Radiology:    CT Results  (Last 48 hours)               10/08/17 0847  CT ABD PELV W CONT Final result    Impression:  IMPRESSION: No evidence of acute pathology. Cholecystectomy. No change of liver   and splenic cysts. No change of left renal calculus. Sigmoid diverticulosis.           Narrative:  CT OF THE ABDOMEN AND PELVIS       CT of the abdomen and pelvis was performed with oral and intravenous contrast.    Isovue-300 was injected. Images were obtained from the lung bases to the pubic   symphysis. Axial, coronal and sagittal images were also obtained. Utilizing   manufacturer algorithms, the examination was performed to optimize imaging   qualities while utilizing the lowest possible radiation dose.       History: Right lower quadrant pain radiating to the right flank for 3 months.   Cholecystectomy and hypertension..       Comparison: Noncontrast CT colonoscopy dated 03/05/2007.       LUNG BASES: Normal.   LIVER: There has been no change of a 1.6 cm probable cyst in the right lobe.   There is no evidence of a mass or extrahepatic biliary dilatation. There is some   intrahepatic biliary dilatation probably due to the cholecystectomy.   SPLEEN: There has been no change of a 3.9 and a 1.9 cm probable cyst in the   anterior spleen which is otherwise normal.   PANCREAS: Normal. There is no evidence of a mass, pancreatitis or pseudocyst.   ADRENAL GLANDS: Normal.   KIDNEYS: The right kidney is normal. There is a 3 mm calculus in the anterior   left left kidney which is unchanged. There is no evidence of  other calculi or   hydronephrosis. There is a calcified  phlebolith in the right pelvis which is not   new. Calculi can be obscured by intravenous contrast.   AORTA: There is no evidence of aortic aneurysm. There is some calcified   atherosclerosis.   GALLBLADDER: There is a cholecystectomy. There is no gross evidence of biliary   gallstones. Ultrasound is suggested if clinically warranted.   APPENDIX: The appendix is not definitively visualized but there are no secondary   signs of appendicitis such as an appendicolith, perforation, abscess or   mesenteric infiltration/induration.Marland Kitchen    LYMPHADENOPATHY: There is no evidence of lymphadenopathy.   BOWEL: There is some distention of the sigmoid colon. There is sigmoid   diverticulosis. The remaining bowel appears to be grossly normal. There is no   evidence of colitis/diverticulitis, bowel- containing hernia, perforation or   abscess.        The urinary bladder is not opacified. It is grossly normal. Calcified   phleboliths noted.       Miscellaneous: The uterus and adnexa appear to be grossly normal.              Radiology interpretation reviewed by Lorenda Peck, MD      ED Course:   Reassessed the patient who is feeling better and wants to go home.     10:32 AM:  Patient was reassessed prior to discharge. Patient???s symptoms Resolved. I personally discussed discharge plan with patient, who understands instructions. All questions were answered.  Patient advised to follow up with PMD and/or specialist. Patient instructed to return to the ER if symptoms persist, change or worsen. Patient agrees with plan.     Final Diagnosis:   1. Abdominal pain, right lower quadrant         Status at Disposition: stable         Lorenda Peck, MD    I, Joylene Draft, am serving as a scribe to document services personally performed by Lorenda Peck, MD  based on my observation and the provider's statements to me.    I attest the person(s) noted above, acting as my scribe(s), has/have observed my performance of the services and has  documented them in accordance with my direction. I have personally reviewed the above information and have ordered and reviewed the diagnostic studies, unless otherwise noted.    Lorenda Peck, MD

## 2017-10-08 NOTE — ED Notes (Signed)
C/o RLQ pain radiating to back x 2 months

## 2017-10-08 NOTE — ED Notes (Signed)
Pt was signed out for Ct abdomen/ pelvis ,not completed yet  . Signed out to Dr Forde Dandy. Samantha Nixon

## 2017-10-08 NOTE — ED Provider Notes (Signed)
HPI     52 yo female with history of HTN presents to the ED with complaints of right lower quadrant pain radiates toward the right flank x 2-3 months, worse at night, better as the day progresses. No N/ V/ D/ constipation. She also denies any urinary complaints. She has had coloscopy ( Dr Derrill Kay) and has been evaluated by her gynecologist with no diagnosis. Pt says her PCP had said he would order a CT scan of the abdomen but at this point hasn't. Pain is nagging, she is coming to the ED requests CT scan.     Past Medical History:   Diagnosis Date   ??? Hypertension        Past Surgical History:   Procedure Laterality Date   ??? HX BREAST REDUCTION     ??? HX CHOLECYSTECTOMY           Family History:   Problem Relation Age of Onset   ??? Hypertension Mother    ??? Heart Disease Father         MI in 37's       Social History     Socioeconomic History   ??? Marital status: SINGLE     Spouse name: Not on file   ??? Number of children: Not on file   ??? Years of education: Not on file   ??? Highest education level: Not on file   Occupational History   ??? Not on file   Social Needs   ??? Financial resource strain: Not on file   ??? Food insecurity:     Worry: Not on file     Inability: Not on file   ??? Transportation needs:     Medical: Not on file     Non-medical: Not on file   Tobacco Use   ??? Smoking status: Never Smoker   ??? Smokeless tobacco: Never Used   Substance and Sexual Activity   ??? Alcohol use: No   ??? Drug use: No   ??? Sexual activity: Not on file   Lifestyle   ??? Physical activity:     Days per week: Not on file     Minutes per session: Not on file   ??? Stress: Not on file   Relationships   ??? Social connections:     Talks on phone: Not on file     Gets together: Not on file     Attends religious service: Not on file     Active member of club or organization: Not on file     Attends meetings of clubs or organizations: Not on file     Relationship status: Not on file   ??? Intimate partner violence:     Fear of current or ex partner: Not on  file     Emotionally abused: Not on file     Physically abused: Not on file     Forced sexual activity: Not on file   Other Topics Concern   ??? Not on file   Social History Narrative   ??? Not on file         ALLERGIES: Shellfish derived    Review of Systems   Constitutional: Negative for appetite change, fatigue and fever.   Respiratory: Negative for shortness of breath.    Cardiovascular: Negative for chest pain.   Gastrointestinal: Positive for abdominal pain. Negative for constipation, diarrhea, nausea and vomiting.   Genitourinary: Negative for difficulty urinating, dysuria, flank pain, menstrual problem, vaginal bleeding, vaginal discharge and  vaginal pain.   Musculoskeletal: Negative for back pain.   All other systems reviewed and are negative.      Vitals:    10/08/17 0450 10/08/17 0452   BP: 157/85    Pulse: 67    Resp: 16    Temp: 98.1 ??F (36.7 ??C)    SpO2:  95%   Weight: 108.9 kg (240 lb)    Height: 5\' 4"  (1.626 m)             Physical Exam   Constitutional: She is oriented to person, place, and time. She appears well-developed and well-nourished. No distress.   HENT:   Head: Normocephalic and atraumatic.   Mouth/Throat: Oropharynx is clear and moist.   Eyes: Pupils are equal, round, and reactive to light. Conjunctivae and EOM are normal. No scleral icterus.   Neck: Normal range of motion. Neck supple.   Cardiovascular: Normal rate, regular rhythm and intact distal pulses.   Pulmonary/Chest: Effort normal and breath sounds normal. No respiratory distress.   Abdominal: Soft. Bowel sounds are normal. She exhibits no distension. There is no tenderness. There is no rigidity, no rebound, no guarding, no CVA tenderness, no tenderness at McBurney's point and negative Murphy's sign.   Musculoskeletal: Normal range of motion.   Neurological: She is alert and oriented to person, place, and time.   Skin: Skin is warm and dry.   Psychiatric: She has a normal mood and affect. Her behavior is normal.   Nursing note and  vitals reviewed.       MDM  Number of Diagnoses or Management Options     Amount and/or Complexity of Data Reviewed  Clinical lab tests: ordered  Tests in the radiology section of CPT??: ordered  Review and summarize past medical records: yes           Procedures    I, Levonne Hubert, reviewed the patient's past history, allergies and home medications as documented in the nursing chart.    I have reviewed the following home medications:    Prior to Admission medications    Medication Sig Start Date End Date Taking? Authorizing Provider   amLODIPine (NORVASC) 5 mg tablet TAKE 2 TABLETS BY MOUTH EVERY DAY 05/19/17   Provider, Historical       Labs:  No results found for this or any previous visit (from the past 96 hour(s)).      Radiology:    No results found.      Differential Diagnosis:  Differential Diagnosis includes but is not limited to:   Hernia  Musculoskeletal pain   Colitis   Kidney stone       ED Course:   52 yo female presents to the ED with complaints of nagging right lower quadrant pain radiates to the right flank x 2-3 months. No associated symptoms of N/ V/ D/ Dysuria.   She has been evaluated by GI and GYN since pain began. She has also been evaluated by her PCP with no diagnosis. She is requesting a CT scan now.   CBC, CMP, Lipase, UA, CT scan with contrast ordered.  Patient to be endorsed to Dr Ruel Favors, ED attending for continuation of care. Workup is pending.     Final Impression/Diagnosis:   Encounter Diagnoses     ICD-10-CM ICD-9-CM   1. Abdominal pain, right lower quadrant R10.31 789.03       Patient condition at time of disposition: TBD         I, Ashley Mariner  that all of the information above was obtained by myself under the supervision of Jahmez Bily, Genevie AnnPriti V, MD. I have ordered and reviewed the diagnostic studies, unless otherwise noted.     Levonne HubertAlicia Olmoz MSN, FNP-BC, ENP-C         I was personally available for consultation in the emergency department.  I have reviewed the chart and agree  with the [facility], including the assessment, treatment plan, and disposition.  Earnie LarssonPriti Andilynn Delavega V, MD

## 2019-03-06 ENCOUNTER — Ambulatory Visit: Attending: Surgery | Primary: Pulmonary Disease

## 2019-03-06 ENCOUNTER — Ambulatory Visit: Admit: 2019-03-06 | Discharge: 2019-03-06 | Payer: MEDICAID | Attending: Surgery | Primary: Pulmonary Disease

## 2019-03-06 DIAGNOSIS — K625 Hemorrhage of anus and rectum: Secondary | ICD-10-CM

## 2019-03-06 NOTE — Progress Notes (Signed)
[name], MD  [address], [place], [place] [address]   Phone: [phone]   Fax: [phone]        Date: 03/06/2019       Name: .Samantha Nixon    DOB: [birth year]     MRN: [id number]     Subjective:     Samantha Nixon is an 53 y.o. female referred by Dr. Lonni Fix who presents for evaluation of hemorrhoids and rectal bleeding. She has experienced some intermitten bleeding and without fever, melena, abdominal pain or weight loss for 5 year(s) . Bleeding is described as blood streaking outside of stool.  Bleeding is painless.  The patient states that the bleeding is aggravated on exercising, mostly squats..  Patient is having no rectal pain. The patient has not experienced prolapse of tissue from the anus. The patient denies of perianal swelling. Bowel habits are reported as normal. Stool is reported as normal without unsual diarrhea, constipation,  or pain. Denies  fever. She has tried hemorrhoid cream.  Patient states that she has right lower quadrant abdominal pain radiating to the back.  The patient had multiple colonoscopies done in the past the recent one was done last year and had few polyps which were benign.  The patient was also noted to have internal hemorrhoids.  Denies tenesmus.  No incontinence to stool or flatus.  Denies nausea or vomiting.  No weight loss.  No loss of appetite.  Patient has not had a colonoscopy 1  year(s) ago.  .    Patient denies a personal history of  colon cancer and IBD. Patient denies a family history of colon cancer and IBD.    Patient Active Problem List   Diagnosis Code   ??? Bright red rectal bleeding K62.5   ??? Internal hemorrhoids K64.8     Patient Active Problem List    Diagnosis Date Noted   ??? Bright red rectal bleeding 03/06/2019   ??? Internal hemorrhoids 03/06/2019     Current Outpatient Medications   Medication Sig Dispense Refill   ??? meloxicam (MOBIC) 15 mg tablet Take 1 Tab by mouth daily. 30 Tab 0    ??? cyclobenzaprine (FLEXERIL) 10 mg tablet Take 1 Tab by mouth three (3) times daily as needed for Muscle Spasm(s). 21 Tab 0   ??? amLODIPine (NORVASC) 5 mg tablet TAKE 2 TABLETS BY MOUTH EVERY DAY  3     Allergies   Allergen Reactions   ??? Shellfish Derived Anaphylaxis     Per pt. 12/10/13- no longer an allergy./rsh     Past Medical History:   Diagnosis Date   ??? Hypertension      Past Surgical History:   Procedure Laterality Date   ??? HX BREAST REDUCTION     ??? HX CHOLECYSTECTOMY       Family History   Problem Relation Age of Onset   ??? Hypertension Mother    ??? Heart Disease Father         MI in 55's     Social History     Tobacco Use   ??? Smoking status: Never Smoker   ??? Smokeless tobacco: Never Used   Substance Use Topics   ??? Alcohol use: No        Review of Systems   A comprehensive review of systems was negative except for that written in the HPI.     Objective:     Visit Vitals  Ht 5\' 4"  (1.626 m)   Wt 260 lb (  117.9 kg)   BMI 44.63 kg/m??      General:age appropriate and overweight, alert, cooperative, no distress  Psych: Alert, oriented, thought content appropriate  Head and Neck: Normocephalic, without obvious abnormality, atraumatic, No thyromegaly  Eyes: conjunctivae/corneas clear. PERRL, EOM's intact. Fundi benign  Ear: Hearing: normal  CVS: Regular rate and rhythm  RS: normal air entry  Abdomen: Inspection:  Contour:  rounded, obese and protuberant  Palpation:  Soft, nondistended, nontender.  No organomegaly.  No free fluid.  No hepatosplenomegaly.  No mass noted in the right lower quadrant.  No hernia.  Lymph Nodes: No generalized lymphadenopathy  Extremities: Moves all 4 extremities, no sensory or motor deficit  Skin: color normal, mobility and turgor normal  Anorectal: Inspection revealed: normal findings  Digital exam revealed: normal sphincter tone, normal squeeze pressure, no mass and soft stool in rectal vault   Anoscopy revealed: Internal hemorrhoids anterior midline, right posterolateral, left posterolateral and left anterolateral-grade 2 internal hemorrhoid.  Mild redundant rectal mucosa.    Discussed surgery vs. rubberband ligation, as well as medical alternatives. Explained that RBL may require more than one treatment. Explained risk of bleeding, infection/pelvic sepsis, pain, urinary dysfunction, external hemorrhoid thrombosis, and recurrent/persistent symptoms. Confirmed that patient will not be traveling within next 2 weeks. Rubberband ligated the anterior midline bundle using the Suction Hemorroidal Ligator. Patient tolerated the procedure well. Literature given to the patient.  The importance of a higher fiber diet with better hydration was emphasized.        Assessment/Plan:   53 year old female referred for evaluation of rectal bleeding and internal hemorrhoids.  On physical examination anoscopy the patient has grade 2 internal hemorrhoids.  I have discussed the etiology, pathology, pathophysiology and the treatment options of internal hemorrhoid including rubber band ligation.  Rubber band ligation of the anterior hemorrhoid complex was performed without complication today.  Advised the patient to take fiber supplements with plenty of liquids.  I will follow this patient in 2 weeks.  She needs rubber band ligation of the rest of the hemorrhoidal complex.    Thank you for allowing me to participate in this patients care and please call me on [phone] with questions and concerns  The primary encounter diagnosis was Bright red rectal bleeding. A diagnosis of Internal hemorrhoids was also pertinent to this visit.      Cain Saupe, MD

## 2019-03-06 NOTE — Progress Notes (Signed)
Progress Notes by [name], MD at 03/06/19 1345                Author: [name], MD  Service: --  Author Type: Physician       Filed: 03/06/19 1507  Encounter Date: 03/06/2019  Status: Signed          Editor: [name], MD (Physician)                      [name], MD   [address], [place], [place] [address]    Phone: [phone]   Fax: [phone]          Date: 03/06/2019         Name: .Samantha Nixon     DOB: 08/26/1965       MRN: [id number]         Subjective:        Samantha Nixon is a 53 y.o.  female referred by Dr. Lonni Fix who presents for evaluation of hemorrhoids and rectal bleeding . She has experienced some intermitten bleeding and without fever, melena, abdominal  pain or weight loss for 5 year(s) . Bleeding is described as blood streaking outside of stool.  Bleeding is painless.  The patient states that the bleeding is aggravated on exercising, mostly squats..   Patient is having no rectal pain. The patient has not experienced prolapse of tissue from the anus. The patient denies of perianal swelling. Bowel habits are reported as normal . Stool is reported as normal without unsual diarrhea, constipation,  or pain. Denies   fever. She has tried hemorrhoid cream .  Patient states that she has right lower quadrant abdominal pain radiating to the back.  The patient had multiple colonoscopies done in the past the recent one was done last year and had few polyps which were benign.  The patient was also noted to have  internal hemorrhoids.  Denies tenesmus.  No incontinence to stool or flatus.  Denies nausea or vomiting.  No weight loss.  No loss of appetite.   Patient has not had a colonoscopy 1  year(s) ago.   .      Patient denies a personal history of  colon cancer and IBD. Patient denies a family  history of colon cancer and IBD.        Patient Active Problem List        Diagnosis  Code         ?  Bright red rectal bleeding  K62.5          ?  Internal hemorrhoids  K64.8          Patient Active Problem List           Diagnosis  Date Noted         ?  Bright red rectal bleeding  03/06/2019         ?  Internal hemorrhoids  03/06/2019          Current Outpatient Medications          Medication  Sig  Dispense  Refill           ?  meloxicam (MOBIC) 15 mg tablet  Take 1 Tab by mouth daily.  30 Tab  0           ?  cyclobenzaprine (FLEXERIL) 10 mg tablet  Take 1 Tab by mouth three (3) times daily as needed for Muscle Spasm(s).  21 Tab  0           ?  amLODIPine (NORVASC) 5 mg tablet  TAKE 2 TABLETS BY MOUTH EVERY DAY    3          Allergies        Allergen  Reactions         ?  Shellfish Derived  Anaphylaxis             Per pt. 12/10/13- no longer an allergy./rsh          Past Medical History:        Diagnosis  Date         ?  Hypertension            Past Surgical History:         Procedure  Laterality  Date          ?  HX BREAST REDUCTION              ?  HX CHOLECYSTECTOMY              Family History         Problem  Relation  Age of Onset          ?  Hypertension  Mother       ?  Heart Disease  Father                MI in 26's          Social History          Tobacco Use         ?  Smoking status:  Never Smoker     ?  Smokeless tobacco:  Never Used       Substance Use Topics         ?  Alcohol use:  No            Review of Systems    A comprehensive review of systems was negative except for that written in the HPI.         Objective:        Visit Vitals      Ht  5\' 4"  (1.626 m)     Wt  260 lb (117.9 kg)        BMI  44.63 kg/m??         General:age appropriate and overweight, alert, cooperative, no distress   Psych: Alert, oriented, thought content appropriate   Head and Neck: Normocephalic, without obvious abnormality, atraumatic, No thyromegaly   Eyes: conjunctivae/corneas clear. PERRL, EOM's intact. Fundi benign   Ear: Hearing: normal   CVS: Regular rate and rhythm   RS: normal air entry   Abdomen: Inspection:  Contour:  rounded, obese and protuberant    Palpation:  Soft, nondistended, nontender.  No organomegaly.  No free fluid.  No hepatosplenomegaly.  No mass noted in the right lower quadrant.  No hernia.   Lymph Nodes: No generalized lymphadenopathy   Extremities: Moves all 4 extremities, no sensory or motor deficit   Skin: color normal, mobility and turgor normal   Anorectal: Inspection revealed: normal findings   Digital exam revealed: normal sphincter tone, normal squeeze pressure, no mass and soft stool in rectal vault   Anoscopy revealed: Internal hemorrhoids anterior midline, right posterolateral, left posterolateral and left anterolateral-grade 2 internal hemorrhoid.  Mild redundant rectal mucosa.      Discussed surgery vs. rubberband ligation, as well as medical alternatives. Explained that RBL  may require more than one treatment. Explained risk of bleeding, infection/pelvic sepsis, pain, urinary dysfunction, external hemorrhoid thrombosis, and recurrent/persistent  symptoms. Confirmed that patient will not be traveling within next 2 weeks. Rubberband ligated the anterior midline bundle using the Suction  Hemorroidal Ligator. Patient tolerated the procedure well. Literature given to the patient.   The importance of a higher fiber diet with better hydration was emphasized.              Assessment/Plan:     53 year old female referred for evaluation of rectal bleeding and internal hemorrhoids.  On physical examination anoscopy the patient has grade 2 internal hemorrhoids.  I have discussed  the etiology, pathology, pathophysiology and the treatment options of internal hemorrhoid including rubber band ligation.   Rubber band ligation of the anterior hemorrhoid complex was performed without complication today.   Advised the patient to take fiber supplements with plenty of liquids.  I will follow this patient in 2 weeks.  She needs rubber band ligation of the rest of the hemorrhoidal complex.      Thank you for allowing me to participate in this patients care  and please call me on [phone] with questions and concerns   The primary encounter diagnosis was Bright red rectal bleeding. A diagnosis of Internal hemorrhoids was also pertinent to this visit.         Manus Gunningajkumar Daryl Quiros, MD

## 2019-03-20 ENCOUNTER — Encounter: Attending: Surgery | Primary: Pulmonary Disease

## 2019-03-25 ENCOUNTER — Ambulatory Visit: Attending: Cardiovascular Disease | Primary: Pulmonary Disease

## 2019-03-25 ENCOUNTER — Ambulatory Visit
Admit: 2019-03-25 | Discharge: 2019-03-25 | Payer: MEDICAID | Attending: Cardiovascular Disease | Primary: Pulmonary Disease

## 2019-03-25 DIAGNOSIS — R002 Palpitations: Secondary | ICD-10-CM

## 2019-03-25 NOTE — Progress Notes (Signed)
03/25/2019     Patient:  Samantha Nixon   DOB:  07-Mar-1966       CHIEF COMPLAINT:   Chief Complaint   Patient presents with   ??? Hypertension         HISTORY OF PRESENT ILLNESS: Samantha Nixon presents for cardiology follow up. Last visit with me almost 2 yrs ago. Very rare/mild palpitations -- resolves with a cough. Denies CP, SOB, dizziness.    PAST MEDICAL HISTORY:  Past Medical History:   Diagnosis Date   ??? Hypertension         PAST SURGICAL HISTORY:  Past Surgical History:   Procedure Laterality Date   ??? HX BREAST REDUCTION     ??? HX CHOLECYSTECTOMY     ??? HX HEMORRHOIDECTOMY         MEDICATIONS:  Current Outpatient Medications   Medication Sig Dispense Refill   ??? amLODIPine (NORVASC) 5 mg tablet 5 mg.  3        ALLERGIES:   Allergies   Allergen Reactions   ??? Shellfish Derived Anaphylaxis     Per pt. 12/10/13- no longer an allergy./rsh       SOCIAL HISTORY:  Social History     Socioeconomic History   ??? Marital status: SINGLE     Spouse name: Not on file   ??? Number of children: Not on file   ??? Years of education: Not on file   ??? Highest education level: Not on file   Tobacco Use   ??? Smoking status: Never Smoker   ??? Smokeless tobacco: Never Used   Substance and Sexual Activity   ??? Alcohol use: No   ??? Drug use: No         FAMILY HISTORY:  Family History   Problem Relation Age of Onset   ??? Hypertension Mother    ??? Heart Disease Father         MI in 36's       REVIEW OF SYSTEMS:  Neuro: No headache, focal weakness/numbness, parasthesias  Eyes: No visual disturbance  ENMT: No soar throat, nasal congestion, hearing loss, tinnitus  CV: No chest pain, dizziness, syncope, claudication  Resp: No dyspnea, cough  GI: No abdominal pain, N/V, diarrhea, constipation  GU: No hematuria, dysuria  Skin: No rash  Heme: rectal bleeding  Constitutional: No fatigue, weakness, fever, weight change  Musculoskeletal: R arm pain -- seeing ortho  Other:    PHYSICAL EXAM:   Blood pressure (!) 140/82, pulse 66, height 5\' 4"  (1.626 m), weight 262 lb (118.8 kg), SpO2 98 %.   Body mass index is 44.97 kg/m??.   General: Well developed, well nourished, no acute distress, appears stated age  Eyes: Anicteric, no hemorrhage, normal appearing conjunctivae and lids  ENMT: Normal externally  Neck: Supple, no JVD, no carotid bruit  CV: RRR, normal S1S2, 1/6 mid syst murmur RUSB  Lungs: CTA bilaterally, normal respiratory excursion  Abd: Soft, NT, ND, no palpable mass  Extremities: No pedal edema  Skin: Warm and dry  Musculoskeletal: Normal muscle tone, normal gait  Neuro: Nonfocal exam, no rigidity/tremor  Psych: Alert and oriented, appropriate affect      LABS AND TESTING:  ?? EKG: NSR, nl axis/intervals, lateral TWI (unchanged)   ?? Echo 05/14/13 - mild/mod LVH, nl LV/RV size/fxn, diast dysfxn, mild LAE, nl valves  ?? Carotid US 12/04/13 - nl  ?? 24 hr holter 12/04/13 - NSR 48-114, avg 71, occasional PVC's, brief SVT x 1  ??  Nuclear 12/05/13 - Bruce x 9 min, nl LVEF, no ischemia/infarct    ASSESSMENT AND PLAN:    1. HTN - Blood pressure is mildly elevated at today???s visit. Better at recent PCP visits. Continue amlodipine 5. Encouraged diet/exercise.   2. Murmur - Check echo.      Cranford Mon, MD

## 2019-03-25 NOTE — Progress Notes (Signed)
03/25/2019     Patient:  Samantha Nixon   DOB:  [birth year]       CHIEF COMPLAINT:   Chief Complaint   Patient presents with   ??? Hypertension         HISTORY OF PRESENT ILLNESS: Christol Thetford presents for cardiology follow up. Last visit with me almost 2 yrs ago. Very rare/mild palpitations -- resolves with a cough. Denies CP, SOB, dizziness.    PAST MEDICAL HISTORY:  Past Medical History:   Diagnosis Date   ??? Hypertension         PAST SURGICAL HISTORY:  Past Surgical History:   Procedure Laterality Date   ??? HX BREAST REDUCTION     ??? HX CHOLECYSTECTOMY     ??? HX HEMORRHOIDECTOMY         MEDICATIONS:  Current Outpatient Medications   Medication Sig Dispense Refill   ??? amLODIPine (NORVASC) 5 mg tablet 5 mg.  3        ALLERGIES:   Allergies   Allergen Reactions   ??? Shellfish Derived Anaphylaxis     Per pt. 12/10/13- no longer an allergy./rsh       SOCIAL HISTORY:  Social History     Socioeconomic History   ??? Marital status: SINGLE     Spouse name: Not on file   ??? Number of children: Not on file   ??? Years of education: Not on file   ??? Highest education level: Not on file   Tobacco Use   ??? Smoking status: Never Smoker   ??? Smokeless tobacco: Never Used   Substance and Sexual Activity   ??? Alcohol use: No   ??? Drug use: No         FAMILY HISTORY:  Family History   Problem Relation Age of Onset   ??? Hypertension Mother    ??? Heart Disease Father         MI in 70's       REVIEW OF SYSTEMS:  Neuro: No headache, focal weakness/numbness, parasthesias  Eyes: No visual disturbance  ENMT: No soar throat, nasal congestion, hearing loss, tinnitus  CV: No chest pain, dizziness, syncope, claudication  Resp: No dyspnea, cough  GI: No abdominal pain, N/V, diarrhea, constipation  GU: No hematuria, dysuria  Skin: No rash  Heme: rectal bleeding  Constitutional: No fatigue, weakness, fever, weight change  Musculoskeletal: R arm pain -- seeing ortho  Other:    PHYSICAL EXAM:  Blood pressure (!) 140/82, pulse 66, height 5\' 4"  (1.626 m), weight 262 lb  (118.8 kg), SpO2 98 %.   Body mass index is 44.97 kg/m??.   General: Well developed, well nourished, no acute distress, appears stated age  Eyes: Anicteric, no hemorrhage, normal appearing conjunctivae and lids  ENMT: Normal externally  Neck: Supple, no JVD, no carotid bruit  CV: RRR, normal S1S2, 1/6 mid syst murmur RUSB  Lungs: CTA bilaterally, normal respiratory excursion  Abd: Soft, NT, ND, no palpable mass  Extremities: No pedal edema  Skin: Warm and dry  Musculoskeletal: Normal muscle tone, normal gait  Neuro: Nonfocal exam, no rigidity/tremor  Psych: Alert and oriented, appropriate affect      LABS AND TESTING:  ?? EKG: NSR, nl axis/intervals, lateral TWI (unchanged)   ?? Echo 05/14/13 - mild/mod LVH, nl LV/RV size/fxn, diast dysfxn, mild LAE, nl valves  ?? Carotid US 12/04/13 - nl  ?? 24 hr holter 12/04/13 - NSR 48-114, avg 71, occasional PVC's, brief SVT x 1  ??  Nuclear 12/05/13 - Bruce x 9 min, nl LVEF, no ischemia/infarct    ASSESSMENT AND PLAN:    1. HTN - Blood pressure is mildly elevated at today???s visit. Better at recent PCP visits. Continue amlodipine 5. Encouraged diet/exercise.   2. Murmur - Check echo.      Cranford Mon, MD

## 2019-03-27 ENCOUNTER — Encounter: Payer: MEDICAID | Attending: Surgery | Primary: Pulmonary Disease

## 2019-03-29 ENCOUNTER — Encounter: Payer: MEDICAID | Attending: Surgery | Primary: Pulmonary Disease

## 2019-04-12 ENCOUNTER — Encounter: Payer: MEDICAID | Primary: Pulmonary Disease

## 2019-04-26 ENCOUNTER — Ambulatory Visit

## 2019-04-27 ENCOUNTER — Ambulatory Visit: Admit: 2019-04-26 | Payer: MEDICAID | Primary: Pulmonary Disease

## 2019-04-27 DIAGNOSIS — R011 Cardiac murmur, unspecified: Secondary | ICD-10-CM

## 2019-04-29 LAB — TRANSTHORACIC ECHOCARDIOGRAM (TTE) COMPLETE (CONTRAST/BUBBLE/3D PRN)
AV Peak Gradient: 9.5 mmHg
AV Peak Velocity: 153.9 cm/s
AV Velocity Ratio: 0.57
Aortic Root: 2.66 cm
Ascending Aorta: 2.98 cm
Fractional Shortening 2D: 33.5454 %
IVC Sniffing: 0.9 cm
IVSd: 1.33 cm — AB (ref 0.6–0.9)
LA Major Axis: 3.96 cm
LV EDV A4C: 140.2 mL
LV EDV Teich: 0.6366 mL
LV ESV A4C: 31.8109 mL
LV ESV A4C: 66.5 mL
LV ESV Teich: 0.2403 mL
LV ESV Teich: 27.8107 mL
LV Ejection Fraction A4C: 53 %
LV Mass 2D: 227.6 g — AB (ref 67–162)
LVIDd: 4.72 cm (ref 3.9–5.3)
LVIDs: 3.14 cm
LVOT Peak Gradient: 3 mmHg
LVOT Peak Velocity: 87.2 cm/s
LVPWd: 1.18 cm — AB (ref 0.6–0.9)
Left Ventricular Ejection Fraction: 58
MV A Velocity: 102.14 cm/s
MV Area by PHT: 3.2 cm2
MV E Velocity: 71.53 cm/s
MV E Wave Deceleration Time: 234.2 ms
MV E/A: 0.7
MV PHT: 67.9 ms
Mitral Valve E-F Slope by M-mode: 3.0546
PV Max Velocity: 123.09 cm/s
PV Peak Gradient: 6.1 mmHg
TR Max Velocity: 221.06 cm/s
TR Peak Gradient: 19.5 mmHg

## 2019-04-29 LAB — ECHO ADULT COMPLETE
AV Peak Gradient: 9.5 mmHg
AV Peak Velocity: 153.9 cm/s
AV Velocity Ratio: 0.57
Aortic Root: 2.66 cm
Ascending Aorta: 2.98 cm
IVC Sniffing: 0.9 cm
IVSd: 1.33 cm — AB (ref 0.6–0.9)
LA Major Axis: 3.96 cm
LV EDV A4C: 140.2 mL
LV EDV Teich: 0.6366 mL
LV ESV A4C: 66.5 mL
LV ESV Teich: 0.2403 mL
LV Ejection Fraction A4C: 53 %
LV Mass 2D: 227.6 g — AB (ref 67–162)
LVIDd: 4.72 cm (ref 3.9–5.3)
LVIDs: 3.14 cm
LVOT Peak Gradient: 3 mmHg
LVOT Peak Velocity: 87.2 cm/s
LVPWd: 1.18 cm — AB (ref 0.6–0.9)
Left Ventricular Fractional Shortening by 2D: 33.5454 %
Left Ventricular Stroke Volume by 2-D Single Plane- MOD: 31.8109 mL
Left Ventricular Stroke Volume by Teichholz Method: 27.8107 mL
MV A Velocity: 102.14 cm/s
MV Area by PHT: 3.2 cm2
MV E Velocity: 71.53 cm/s
MV E Wave Deceleration Time: 234.2 ms
MV E/A: 0.7
MV PHT: 67.9 ms
Mitral Valve Deceleration Slope: 3.0546
PV Max Velocity: 123.09 cm/s
PV Peak Gradient: 6.1 mmHg
TR Max Velocity: 221.06 cm/s
TR Peak Gradient: 19.5 mmHg

## 2019-04-29 NOTE — Telephone Encounter (Signed)
Notified pt echocardiogram within acceptable range, normal heart function. Pt verbalized understanding.

## 2019-10-08 ENCOUNTER — Encounter: Admit: 2019-10-08 | Discharge: 2019-10-08 | Payer: MEDICAID | Primary: Pulmonary Disease

## 2019-10-08 NOTE — Home Health (Signed)
 54 y/o female sent to [facility] by oncologist Dr. Omar on 6/29 for evaluation of abdominal pain and vomiting after completing chemo therapy Neulasta for treatment of right breast cancer.  Pt. found with colonic obstruction complicated with perforation s/p colectomy and colostomy.  Complicated admission with pneumonia, PE, DVT s/p IVC Filter placement.  PMHx. HTN.  Pt. lives with mother 2 adult sons and 3 grandchildren.  Pt. in the process of moving to [place]  now delayed due to current illness.  Pt. currently doing colostomy care on her own.  Pt. educated about stoma monitoring; should be pink and if blue or dark color to call MD.  [place] for abdominal surgical wound removed yesterday Dr. Jolly. 2 smalls spots pink and draining yellow thin fluid noted.  Wound care provided as follow; cleansed with NS, pad dry, dry dressing applied.  Pt. educated about doing wound care daily and as needed.  Pt. demonstrated proficiency doing her own wound care.  Port left upper chest site intact. Pt. reported weakness/sob when climbing steps.  3 Steps to enter before main door and 7 steps to main level. Lungs clear. Bilateral lower extremities pitting edema +1.  Pt. reported everyone in the family has same trouble.  On furosemide 20 mg tab daily.  Pt. instructed in edema management techniques such as elevating legs, avoiding salt, taking meds as prescribed daily weight monitoring in am before breakfast and notified MD if 2 lbs gained in 1 day or 5 pounds in 1 week.  Patient verbalized understanding with intent to comply.  Pt. reported LLE is  a common problem with older family members.  No diagnoses of HF at this time documented by MD.  Pt. reported feeling unsteady on her feet when climbing steps.  Fall risk moderate, PT eval ordered.  See assessment and interventions for further  details.  ------------------------------------------------------------------------------------------------------------------------------------------------------------------------------------  SN visits to continue for 2w2, 1w1 for multisystem assessment, colostomy care and teaching, medication teaching and monitoring, safety and fall risk monitoring, teaching of PNA and DVT prevention and s/s  PT requested for 1w1 eval  MSW requested for community resources available discussion  Patient is not Diabetic  Patient is not a candidate for Palliative Care  ADR was completed and Level 1 or 2 Severity Drug interactions not noted  RAS Score 8  MAHC 10 4  TUG  13  Falls Risk - Moderate -  MAHC 10 of 4 OR MACH 10 of 5 or greater & TUG of less than or equal to 13  --------------------------------------------------------------------------------------------------------------------------------------------------------  Patient Identified representative is Iris Smyser  Relationship to patient is  mother  Contact information is [phone]  Emergency protocol reviewed with patient and caregiver  Instructed patient on TAL Level 3 , Triage Level 3 and Bill of Rights  Reviewed with patient/caregiver items needed for Go Kit in admission package  Reviewed importance of calling the nurse first with patient and caregiver  Patient/representative provided information on [facility] transfer and discharge policy  In the event of an emergency patient will shelter in place   Inform consent reviewed and signature obtained from [facility] Rights with patient and patient representative Claretta Docker for filling complain  Patient/caregiver verbalized understanding of above instructions  ---------------------------------------------------------------------------------------------------------------------------------------------------------------    Dr. Margaretta called spoke to [facility], receptionist whom verbalized  doctor is agreement with Plan of Care and in agreement to sign home care orders  Medication reconciliation discussed  MD representative Pam.  Pt. has appt. with  Dr. Malmazada tomorrow.  Writer's contact information provided in case MD not in agreement with medication reconciliation    No drug integrations found  Supplies ordered for wound care: sterile and nonsterile woven gauze 4x4, saline, tape, skin prep  ------------------------------------------------------------------------------------------------------------------------------------------------------------------  Patient's goal: medication/colostomy knowledge and wound healing  Progress towards goal:SN/PT visits planned   Plan for next visit:SN to continue 2w2, 1w1 for multisystem assessment and colostomy care and teaching, medication teaching, surgical wound healing monitoring.  Upcoming medical appointments: surgeon Dr. Merilee 8/5, pcp Dr. Margaretta 8/4, oncologist Dr. Omar 8/6,   Pt. reported having appt. yesterday with GI surgeon Dr. Jolly f/u 3 weeks

## 2019-10-09 ENCOUNTER — Encounter: Admit: 2019-10-09 | Discharge: 2019-10-09 | Payer: MEDICAID | Primary: Pulmonary Disease

## 2019-10-09 NOTE — Home Health (Signed)
Patient is 54 yo female, Patient was referred for home care with Dx pneumonia, PE, DVT s/p IVC Filter placement  she was in [facility] 6/29 sent by oncologist for evaluation of abdominal pain and vomiting after completing chemo therapy  found with colonic obstruction complicated with perforation s/p colectomy and colostomy on 7/7  and was in [facility] from 7/9 to 7/30  Patient's medical history : [place], [place],   Found patient sitting in the kitchen stool, daughter in law present during PT eval      Patient Identified representative is self   Emergency protocol reviewed with patient today   Instructed on TAL Level : 3   Triage level Low   Reviewed items needed for on Go Kit in admission packet   Reviewed Bill of Rights with patient for filing complaint   Reviewed importance of calling the nurse first with patient, if patient has new/worsening symptoms   Subjective : patient with complains of generalized weakness, difficulty to negotiate steps  unsteady gait due to bilateral legs feel very heavy, easy fatigue   Prior level of function : prior to hospitalization patient was amb independent without assistive device   Patient Goal : to get stronger and to walk independent without assistive device outdoors  Social/Home Set-Up : Patient lives with mother and 2 adult sons and 3 grandchildren in private house, family provide assistance with ADL's   patient has 7 steps x 2 with single rail to get to main level of the house  POC : PT will follow 1 x per week x 1 week followed by 2 x per week x 3 weeks,   and will work on safe transfer training, gait training indoors and outdoors  on LES strength, on stairs negotiation, on safety awareness, on fall prevention education, and on establishing HEP   called Dr. Dewain Penning office 8/4 around 2 30 pm, spoke with Jeannene Patella, re PT order and POC;   as per Pam MD is in agreement with POC and will sign home care orders   left returned contact information in case MD has questions or concerns    Have you fallen since the last visit? no falls reported   Any Medication Changes since the last visit? no changes reported   Pressure Relief : Patient was instructed in pressure ulcer prevention and pressure relief techniques.   keep skin clean and dry and stay hydrated to stand up with walker every hour,   patient verbalized knowledge and understanding   Educated on Diabetic foot care : NA no Dx of DM   Skin Inspection : grossly intact   Sensation : grossly intact to light touch,   6 min walk - patient amb 100 ft x 1 without assistive device with SBA, on level surface  time - 2 min 5 seconds   BORG Scale - 3   patient has Weights Log   Edema : bilateral distal legs with pitting edema - 2+ bilateral feet  circum 32 cm bilateral ankles around malleolars   instructed patient to elevate legs as tolerated and to perform frequent ankle pumps for edema control,   she verbalized understanding  Precautions : fall,    Weight bearing status - WBAT left LE   Summary of Assessment : patient with decreased LES strength, unsteady gait, easy fatigue   with decreased standing dynamic balance - as evidenced by a low Tinetti score of 13, indicating a high fall risk,   Patient will benefit from restorative PT to  address his physical deficits to maximize his independence and safety with transfers and mobility,   to establish HEP, to work on transfer training and gait training, on stairs negotiation, to instruct in fall prevention and safety   Assess for Palliative Care : patient not a candidate at this time   Discharge Plan : ongoing   Plan for next visit : to cont with education in HEP, to work on transfer training and gait training with rollator and to work on fall prevention, and on deep breathing exercise   DME : has rolling walker    recommended grab bars, and shower chair for safety

## 2019-10-11 ENCOUNTER — Encounter: Admit: 2019-10-11 | Discharge: 2019-10-11 | Payer: MEDICAID | Primary: Pulmonary Disease

## 2019-10-11 NOTE — Home Health (Signed)
 Pt seen for routine SN visit today, pt is awaiting visit seated in kitchen, identifies self by name and dob.  Medications updated.  Pt has no c/o pain, denies pain in past 24 hrs.  surgical site well approximated, photo taken, verbal consent given by pt.  2 smal areas oozing serosanguinous fluid, pt changes dressing daily and prn.  colostomy changed daily.  Pt states surgeon aware of areas and told her that because she is on blood thinners the oozing may continue.  Pt has no local s/s of infection.  See assessments and interventions for further visit information.  All questions answered with verbalized understanding and with the intent to compy.  Pt left in NAD in care of family  Patient Identified representative is declined  Relationship to patient is declined   Contact information is  declined  Emergency protocol reviewed with patient today  Reviewed items needed for on "Go Kit" in admission packet.  Reviewed importance of calling the nurse first

## 2019-10-14 ENCOUNTER — Encounter: Admit: 2019-10-14 | Discharge: 2019-10-14 | Payer: MEDICAID | Primary: Pulmonary Disease

## 2019-10-14 NOTE — Home Health (Signed)
Found patient sitting in the kitchen stool, granddaughter present during PT treatment  Patient Identified representative is self   Reviewed importance of calling the nurse first with patient, if patient has new/worsening symptoms        Subjective : patient with same complains of generalized weakness, difficulty to negotiate steps   unsteady gait due to bilateral legs feel very heavy, easy fatigue    Patient Goal : to get stronger and to walk independent without assistive device outdoors   Have you fallen since the last visit? no falls reported   Any Medication Changes since the last visit? no changes reported   Pressure Relief : re instructed patient in pressure ulcer prevention and pressure relief techniques.   keep skin clean and dry and stay hydrated to stand up with walker every hour,   patient verbalized knowledge and understanding   6 min walk - patient amb 125 ft x 1 without assistive device with CS occ CG outdoors  time - 2 min 34 seconds   BORG Scale - 3   patient has Weights Log   Edema : bilateral distal legs with pitting edema - 2+ bilateral feet   circum 32 cm bilateral ankles around malleolars   re instructed patient to elevate legs as tolerated and to perform frequent ankle pumps for edema control,   she verbalized understanding and reports compliance  Precautions : fall, DVT  Weight bearing status - FWB LES  Summary of the visit : patient with decreased LES strength, unsteady gait, easy fatigue and with decreased standing dynamic balance   PT visit focused on LES strength, on establishing HEP, on transfer training and gait training - assessed outdoor amb  on stairs negotiation, to instruct in fall prevention and safety   Discharge Plan : ongoing   Plan for next visit : to cont with education in HEP, to work on transfer training and gait training   and on stairs negotiation

## 2019-10-15 ENCOUNTER — Encounter: Admit: 2019-10-15 | Discharge: 2019-10-15 | Payer: MEDICAID | Primary: Pulmonary Disease

## 2019-10-15 ENCOUNTER — Encounter: Primary: Pulmonary Disease

## 2019-10-15 NOTE — Home Health (Signed)
PATIENT IS FOUND SEATED IN KITCHEN AWAKE, ALERT, AND ORIENTED TIMES 3.  NO RESP DISTRESS NOTED.  SEE PAIN ASSESSMENT.   VITAL SIGNS STABLE AFEBRILE LUNGS CLEAR BILATERALLY.    ABDOMINAL WOUND WITH   APPOINTMENT ON [facility] [id number]  MD NAJOVITZ/VASCULAR.  LAST COLOSTOMY BAG CHANGE WAS TODAY AND PATIENT REPORT   4 BOWEL MOVEMENT AND BAG CHANGE A DAY.  [id number]  WT 240.0 LBS  [id number]  WT 240.0 LBS.  SEE INTERVENTIONS FOR TEACHINGS.    Reviewed importance of calling the nurse first.  PATIENT VERBALIZED UNDERSTANDING.   MEDLINE: CONFIRMATION #[id number]  1 BOX OF 5X9 ABD PADS/25

## 2019-10-16 ENCOUNTER — Encounter: Admit: 2019-10-16 | Discharge: 2019-10-16 | Payer: MEDICAID | Primary: Pulmonary Disease

## 2019-10-16 NOTE — Home Health (Signed)
Found patient sitting in the kitchen stool, mother present during PT treatment   Patient Identified representative is self   Reviewed importance of calling the nurse first with patient, if patient has new/worsening symptoms        Subjective : patient with same complains of generalized weakness, difficulty to negotiate steps   unsteady gait due to bilateral legs feeling weak and heavy, easy fatigue   Patient Goal : to get stronger and to walk independent without assistive device outdoors   Have you fallen since the last visit? no falls reported   Any Medication Changes since the last visit? no changes reported   Pressure Relief : re instructed patient in pressure ulcer prevention and pressure relief techniques.   keep skin clean and dry and stay hydrated to stand up with walker every hour,   patient verbalized knowledge and understanding   6 min walk - patient amb 140 ft x 1 without assistive device with CS occ CG outdoors   time - 2 min 36 seconds   BORG Scale - 2   patient has Weights Log   Edema : bilateral distal legs with pitting edema - 2+ bilateral feet   circum 32 cm bilateral ankles around malleolars   re instructed patient to elevate legs as tolerated and to perform frequent ankle pumps for edema control,   she verbalized understanding and reports compliance   Precautions : fall, DVT   Weight bearing status - FWB LES   Summary of the visit : patient with decreased LES strength, unsteady gait, easy fatigue and with decreased standing dynamic balance   PT visit focused on LES strength, on establishing HEP, on transfer training and gait training - on outdoor amb   on stairs negotiation, and on fall prevention   Discharge Plan : ongoing   Plan for next visit : to cont with education in HEP, to work on transfer training and gait training   and on stairs negotiation

## 2019-10-17 ENCOUNTER — Encounter: Admit: 2019-10-17 | Discharge: 2019-10-17 | Payer: MEDICAID | Primary: Pulmonary Disease

## 2019-10-17 ENCOUNTER — Encounter: Primary: Pulmonary Disease

## 2019-10-17 NOTE — Home Health (Signed)
MSW EVALUATION  PRESENT FOR VISIT:  Pt and grandaughter  REASON FOR REFERRAL: Community resorce planning  CURRENT HOME SITUATION/FAMILY SUPPORT:  Pt lives with her mother, two sons and three grandchildren (64, 60 83).  They also have a tenant they rent a room to.  Pt moving to [place] next year.  CURRENT MENTAL STATUS: divorced   Oriented to person, place, time and purpose  Communication: clear speech  Memory: short term good    long term   good  Behavior: pleasant and cooperative   Mood:  stable.  Denies depressed or anxious mood.    Insight/judgement:  Pt has Good knowledge and understanding of her medical dx and plan of care.  Psychiatric Hx: none  FINANCIAL SITUATION: Pt currently getting unemployment income.  She has an appt to apply for SSD on August 20 with social security.  Pt also getting cash  assistance from DSS for care of her three grandchildren as she is their legal guardian.  Pt has medicaid.  TRANSPORTATION:  Son drives pt to medical appts  FOOD/MEALS:  Pts mother and grandchildren assisting with meal prep.  PERSONAL CARE/HOUSEKEEPING: Pts mother, sons and grandchildren assist with laundry and housekeeping and errands as needed. Pt is independent with her personal care and grooming.  INTERVENTIONS AND RECOMMENDATIONS:  Carollee Sires pt to assess needs. Pt shared details about her recent medical hx.  Pt dx with breast cancer in May 2021. She started chemo in June but had complications and was hospitalized on June 30 for two weeks and then DC to [facility] for rehab. Pt now with colostomy.   Pt is planning to move to [place] with her family next year.  She was supposedto move this summer but move delayed due to illness.  Pt feels she is managing her care effectively with assistance from family.  She will be applying for SSD and is aware of eligibility and qualification process.  Her initial phone interview is scheduled for August 20.  Pts son  drives pt to  her to medical appts and she can also use  medicaid transportation as she is currently not driving. Pt says she is planning to sign up for food delivery service.  Informed her about [employer] ( meal program for pts and their family dx with breast cancer).  Pt interested.  Referral made on pts behalf.  Spk with Astrid Drafts, program ccordinator.  All pertinent information provided.  Pt will have daily meals sent for herself and for her grandchildren for two months and if she still needs after that she can request additional two months.  Pt was also informed about cancer care support groups and MH therapists available to her as well as  free wigs offered to breast cancer pts.  Olegario Messier to make referral for free wig fitting and pt to call back if she woukd like to participate in support group.  Support and active/theraputic listening provided to pt during visit.

## 2019-10-18 ENCOUNTER — Ambulatory Visit: Attending: Cardiovascular Disease | Primary: Pulmonary Disease

## 2019-10-18 ENCOUNTER — Encounter: Primary: Pulmonary Disease

## 2019-10-18 ENCOUNTER — Ambulatory Visit
Admit: 2019-10-18 | Discharge: 2019-10-18 | Payer: MEDICAID | Attending: Cardiovascular Disease | Primary: Pulmonary Disease

## 2019-10-18 DIAGNOSIS — I10 Essential (primary) hypertension: Secondary | ICD-10-CM

## 2019-10-18 NOTE — Progress Notes (Signed)
10/18/2019     Patient:  Samantha Nixon   DOB:  Jan 30, 1966       CHIEF COMPLAINT:   Chief Complaint   Patient presents with   ??? Hospital Follow Up         HISTORY OF PRESENT ILLNESS: Samantha Nixon presents for cardiology follow up. Complicated hospital admission 08/2019 with abd pain and N/V. Found to have colonic obstruction and perforation requiring surgery. Admission was complicated by DVT/PE and she had IVC filter placed.     At this point DOE is mild. Denies CP, palps or diz.    PAST MEDICAL HISTORY:  Past Medical History:   Diagnosis Date   ??? Cancer (HCC) 06/2019   ??? Diverticulitis    ??? Hypertension         PAST SURGICAL HISTORY:  Past Surgical History:   Procedure Laterality Date   ??? HX BREAST REDUCTION     ??? HX CHOLECYSTECTOMY     ??? HX COLOSTOMY  09/11/2019   ??? HX HEMORRHOIDECTOMY         MEDICATIONS:  Current Outpatient Medications   Medication Sig Dispense Refill   ??? cephALEXin (KEFLEX) 500 mg capsule Take 500 mg by mouth.     ??? furosemide (LASIX) 40 mg tablet Take 1 Tablet by mouth daily.     ??? famotidine (PEPCID) 20 mg tablet Take 1 Tablet by mouth daily.     ??? potassium chloride SR (K-TAB) 20 mEq tablet Take 1 Tablet by mouth daily.     ??? acetaminophen (Tylenol Extra Strength) 500 mg tablet Take 2 Tablets by mouth every six (6) hours as needed for Pain.     ??? cyclobenzaprine (FLEXERIL) 10 mg tablet Take 1 Tablet by mouth every eight (8) hours as needed for Muscle Spasm(s).     ??? apixaban (Eliquis) 5 mg tablet Take 1 Tablet by mouth every twelve (12) hours.     ??? ascorbic acid (ORANGE CHEWABLE C PO) Take 1 Tablet by mouth daily. 500 mg tablet     ??? cholecalciferol, vitamin D3, (VITAMIN D3 PO) Take 2 Gum by mouth daily. 3000 units per serving (2 gummies equals 1 serving)     ??? multivit-minerals/folic acid (MULTIVITAMIN GUMMIES PO) Take 2 Gum by mouth daily.     ??? docusate sodium (COLACE) 100 mg capsule Take 1 Tablet by mouth two (2) times a day.     ??? ferrous gluconate 270 mg (27 mg iron) tab Take 1 Tablet by  mouth daily. at 12pm     ??? senna-docusate (Senokot-S) 8.6-50 mg per tablet Take 1 Tablet by mouth daily as needed for Constipation.          ALLERGIES:   Allergies   Allergen Reactions   ??? Shellfish Derived Anaphylaxis     Per pt. 12/10/13- no longer an allergy./rsh       SOCIAL HISTORY:  Social History     Socioeconomic History   ??? Marital status: SINGLE     Spouse name: Not on file   ??? Number of children: Not on file   ??? Years of education: Not on file   ??? Highest education level: Not on file   Tobacco Use   ??? Smoking status: Never Smoker   ??? Smokeless tobacco: Never Used   Substance and Sexual Activity   ??? Alcohol use: No   ??? Drug use: No     Social Determinants of Health     Financial Resource Strain:    ???  Difficulty of Paying Living Expenses:    Food Insecurity:    ??? Worried About Programme researcher, broadcasting/film/video in the Last Year:    ??? Barista in the Last Year:    Transportation Needs:    ??? Freight forwarder (Medical):    ??? Lack of Transportation (Non-Medical):    Physical Activity:    ??? Days of Exercise per Week:    ??? Minutes of Exercise per Session:    Stress:    ??? Feeling of Stress :    Social Connections:    ??? Frequency of Communication with Friends and Family:    ??? Frequency of Social Gatherings with Friends and Family:    ??? Attends Religious Services:    ??? Database administrator or Organizations:    ??? Attends Engineer, structural:    ??? Marital Status:          FAMILY HISTORY:  Family History   Problem Relation Age of Onset   ??? Hypertension Mother    ??? Heart Disease Father         MI in 35's       REVIEW OF SYSTEMS:  Neuro: No headache, focal weakness/numbness, parasthesias  Eyes: No visual disturbance  ENMT: No soar throat, nasal congestion, hearing loss, tinnitus  CV: No chest pain, palpitations, dizziness, syncope, claudication  Resp: dyspnea  GI: No abdominal pain, N/V, diarrhea, constipation  GU: No hematuria, dysuria  Skin: No rash  Heme: No bleeding, bruising  Constitutional:  fatigue  Musculoskeletal: No myalgias, arthralgias  Other:    PHYSICAL EXAM:  Blood pressure 122/80, pulse 91, height 5\' 4"  (1.626 m), weight 241 lb (109.3 kg), SpO2 98 %.   Body mass index is 41.37 kg/m??.   General: Well developed, well nourished, no acute distress, appears stated age  Eyes: Anicteric, no hemorrhage, normal appearing conjunctivae and lids  ENMT: Normal externally  Neck: Supple, no JVD, no carotid bruit  CV: RRR, normal S1S2, no M/G/R  Lungs: CTA bilaterally, normal respiratory excursion  Abd: Soft, NT, ND, no palpable mass  Extremities: 1+ pedal edema  Skin: Warm and dry  Musculoskeletal: Normal muscle tone, normal gait  Neuro: Nonfocal exam, no rigidity/tremor  Psych: Alert and oriented, appropriate affect      LABS AND TESTING:  ?? EKG: NSR, PAC, no ST/T changes  ?? Labs: 09/2019 - H/H 8.7, BMP nl  ?? Echo 05/14/13 - mild/mod LVH, nl LV/RV size/fxn, diast dysfxn, mild LAE, nl valves  ?? Carotid 07/14/13 12/04/13 - nl  ?? 24 hr holter 12/04/13 - NSR 48-114, avg 71, occasional PVC's, brief SVT x 1  ?? Nuclear 12/05/13 - Bruce x 9 min, nl LVEF, no ischemia/infarct  ?? Echo 04/2019 - TDS, mild LVH, nl LV/RV size/fxn, mild LAE, nl valves    ASSESSMENT AND PLAN:    1. HTN - Blood pressure is well controlled. Continue current antihypertensive regimen.   2. Chronic diast HF - Stable in this regard. Recent echo as above. Continue lasix and KCl. Follow labs.  3. DVT/PE - Provoked event during hospital admission and following a major surgery. Also with cancer hx. IVC filter placed 09/2019. Continue eliquis. Continue hem-onc followup. Vascular followup re IVC filter.    10/2019, MD

## 2019-10-23 ENCOUNTER — Encounter: Admit: 2019-10-23 | Discharge: 2019-10-23 | Payer: MEDICAID | Primary: Pulmonary Disease

## 2019-10-23 ENCOUNTER — Encounter: Primary: Pulmonary Disease

## 2019-10-23 NOTE — Home Health (Signed)
called patient to confirm scheduled PT appointment and perform Covid screening  was notified that patient is in [facility] ED with elevated pain in pelvis  patient said she will be discharged later today, next PT appointment is scheduled for tomorrow 8/19  called Dr. Van Clines office 8/18 around 2 45 pm, spoke with Elita Quick, re missed PT visit  left returned contact information in case MD has questions or concerns

## 2019-10-24 ENCOUNTER — Encounter: Admit: 2019-10-24 | Discharge: 2019-10-24 | Payer: MEDICAID | Primary: Pulmonary Disease

## 2019-10-24 NOTE — Home Health (Signed)
Arrived to patient home, pt known to clinician from prior encounters.  Pt is standing in kitchen attempting to remove old adhesive from around stoma site.  Adhesive remover pads provided to patient with instructions for use.  Pt states that wafer part of ostomy appliance isn't sticking to skin.  Site assessed, assisted patient with placing colostomy appliance (2 pc coloplast).  See wound assessment for further information.  Medications reviewed with no changes noted.  Pt keeps thanking this Clinical research associate for her assistance and how much better the appliance feels.  Pts VS WNL, lungs clear to auscultation B/L.  No edema noted to extremities.  See assessments and interventions for further visit information.  All questions answered with verbalized understanding with intent to comply.  Pt left in NAD in care of multiple grandchildren.  Patient Identified representative is self  Relationship to patient is self  (friend, neighbor, son, or daughter)  Contact information is   (cell, home, work)    Emergency protocol reviewed with patient today  Instructed on Tal Level 3  Triage level LOW  Reviewed items needed for on "Go Kit" in admission packet.  Reviewed importance of calling the nurse first

## 2019-10-24 NOTE — Home Health (Signed)
Found patient sitting in the kitchen stool, mother present during PT treatment   Patient Identified representative is self   Reviewed importance of calling the nurse first with patient, if patient has new/worsening symptoms                Subjective : patient with same complains of generalized weakness, difficulty to negotiate steps   unsteady gait due to bilateral legs feeling weak and heavy, easy fatigue   Patient Goal : to get stronger and to walk independent without assistive device outdoors   Have you fallen since the last visit? no falls reported   Any Medication Changes since the last visit? no changes reported   Pressure Relief : re instructed patient in pressure ulcer prevention and pressure relief techniques.   keep skin clean and dry and stay hydrated to stand up with walker every hour,   patient verbalized knowledge and understanding   6 min walk - patient amb 125 ft x 1 without assistive device independent around the house  time - 1 min 14 seconds   BORG Scale - 2   patient has Weights Log   Edema : bilateral distal legs with pitting edema - 2+ bilateral feet   circum 32 cm bilateral ankles around malleolars - unchanged  re instructed patient to elevate legs as tolerated and to perform frequent ankle pumps for edema control,   she verbalized understanding and reports compliance   Precautions : fall, DVT   Weight bearing status - FWB LES   Summary of the visit : patient with decreased LES strength, unsteady gait, easy fatigue and with decreased standing dynamic balance   PT visit focused on LES strength, on establishing HEP, on transfer training and gait training - on outdoor amb   on stairs negotiation, and on fall prevention   Discharge Plan : ongoing   Plan for next visit : to cont with education in HEP, to work on transfer training and gait training   and on stairs negotiation

## 2019-10-29 ENCOUNTER — Encounter: Admit: 2019-10-29 | Discharge: 2019-10-29 | Payer: MEDICAID | Primary: Pulmonary Disease

## 2019-10-29 NOTE — Home Health (Signed)
 arrived to home, door unlocked and patient is seated at kitchen counter with grandchildren present.  Pt known to clinician from prior encounters.  Meds reviewed with no changes noted.  Pt has colostomy, which she is able to manage.  Pt c/o wafer not sticking well d/t sweaty skin.  Pt does not currently have air conditioners runnng in main portion of house, ncouraged to use.  Pt saw surgeon yesterday, states MD aware of drainage at inferior incision line, states surgeon did not seem concerned about drainage.  No open areas along incision line, dimpling noted at inferior portion of incision line (which has healed) under pannus, thin serosanguinous drainage noted on dressing.  See wound assessment for further information.  See assessments and interventions for further visit information.  Colostomy wafer noted to be loose and colstomy bag ripped, pt states just happened and wanted to wait for SN to arrive.  See wound assessment for further information.  All questions answered with verbalized understanding and intent to comply.  Pt left in NAD in care of family  Patient Identified representative is self  Relationship to patient is self  (friend, neighbor, son, or daughter)  Contact information is  [phone]  (cell, home, work)    Emergency protocol reviewed with patient today  Instructed on Tal Level   Triage level   Reviewed items needed for on "Go Kit" in admission packet.  Reviewed importance of calling the nurse first

## 2019-10-29 NOTE — Home Health (Signed)
Found patient sitting in the kitchen stool, granddaughter present during PT treatment   Patient Identified representative is self   Reviewed importance of calling the nurse first with patient, if patient has new/worsening symptoms        Subjective : patient without new complains of - she is feeling stronger, still has some difficulty to negotiate steps   and easy fatigue   Patient Goal : to get stronger and to walk independent without assistive device outdoors   Have you fallen since the last visit? no falls reported   Any Medication Changes since the last visit? no changes reported   Pressure Relief : re instructed patient in pressure ulcer prevention and pressure relief techniques.   keep skin clean and dry and stay hydrated to stand up with walker every hour,   patient verbalized knowledge and understanding   6 min walk - patient amb 220 ft x 1 without assistive device with CS outdoors  needed 2 standing rest periods   time - 3 min 40 seconds   BORG Scale - 1   patient has Weights Log   Edema : bilateral distal legs with pitting edema - 2+ bilateral feet   circum 31 cm bilateral ankles around malleolars - decreased by 1 cm  re instructed patient to elevate legs as tolerated and to perform frequent ankle pumps for edema control,   she verbalized understanding and reports compliance   Precautions : fall, DVT   Weight bearing status - FWB LES   Summary of the visit : patient with decreased LES strength, unsteady gait, easy fatigue and with decreased standing dynamic balance   PT visit focused on LES strength, on establishing HEP, on transfer training and gait training - on outdoor amb   on stairs negotiation, and on fall prevention   Discharge Plan : plan to discharge from home PT at the next PT sessio  patient is in agreement with discharge  Plan for next visit : to cont with education in HEP, to work on transfer training and gait training   and on stairs negotiation

## 2019-10-30 ENCOUNTER — Encounter: Primary: Pulmonary Disease

## 2019-10-31 ENCOUNTER — Encounter: Admit: 2019-10-31 | Discharge: 2019-10-31 | Payer: MEDICAID | Primary: Pulmonary Disease

## 2019-10-31 NOTE — Home Health (Signed)
Found patient sitting in the kitchen stool, granddaughter present during PT treatment   Patient Identified representative is self   Reviewed importance of calling the nurse first with patient, if patient has new/worsening symptoms        Subjective : patient without new complains of - reports feeling stronger, but still gets easily fatigue  Patient Goal : to get stronger and to walk independent without assistive device outdoors   Have you fallen since the last visit? no falls reported   Any Medication Changes since the last visit? no changes reported   Pressure Relief : re instructed patient in pressure ulcer prevention and pressure relief techniques.   keep skin clean and dry and stay hydrated to stand up with walker every hour,   patient verbalized knowledge and understanding   6 min walk - patient amb 250 ft x 1 without assistive device with CS outdoors   time - 3 min 38 seconds   BORG Scale - 1   patient has Weights Log   Edema : bilateral distal legs with pitting edema - 2+ bilateral feet   circum 31 cm bilateral ankles around malleolars - decreased by 1 cm   re instructed patient to elevate legs as tolerated and to perform frequent ankle pumps for edema control,   she verbalized understanding and reports compliance   Precautions : fall, DVT   Weight bearing status - FWB LES   Summary of the visit : patient with decreased LES strength, unsteady gait, easy fatigue and with decreased standing dynamic balance   PT visit focused on LES strength, on establishing HEP, on transfer training and gait training - on outdoor amb   on stairs negotiation, and on fall prevention   Discharge Plan : patient is discharge from home PT as of today 8/26  patient is in agreement with discharge, all PT goals are met  called Dr. Van Clines office 8/26 around 3 pm, spoke with Elita Quick, re patient is discharged from home PT  left returned contact information in case MD has questions or concerns   Discharge Forms Issued - Discharge  Instructions

## 2019-11-05 ENCOUNTER — Encounter: Admit: 2019-11-05 | Discharge: 2019-11-05 | Payer: MEDICAID | Primary: Pulmonary Disease

## 2019-11-05 NOTE — Home Health (Signed)
PATIENT IS AWAKE, ALERT, AND ORIENTED TIMES 3.  NO RESP DISTRESS NOTED.    VITAL SIGNS STABLE AFEBRILE LUNGS CLEAR BILATERALLY.  NO LOWER EXTREMITY EDEMA NOTED.    PATIENT DENIES PAIN AT THIS TIME AND REPORT TAKING 1 TAB OF TYLENOL 500 MG YESTERDAY EVENING FOR A TOTAL OF 1 TAB WITHIN THE PAST 24 HOURS.  PATIENT REPORT SHE DOES NOT HAVE ENOUGH SUPPLIES FOR WOUND CARE OR COLOSTOMY.  CHN RECIEVES NEW WOUND CARE SUGGESTION FROM WOUND CARE SPECIALIST/DON TO CLEANSE WOUND WITH NORMAL SALINE, APPLY HYDROFIBER WITH SILVER COVER WITH GAUZE AND SECURE WITH MEFIX TAPE 3  TIMES A WEEK..  CHN CALLED SURGEON MD [phone] NOT AVAILABLE BUT SPOKE WITH MD [employer] AT [facility] AND AS PER FLYTCHER OK WITH NEW WOUND CARE ORDER FROM WOUND CARE SPECIALIST AT [facility].   MEDLINE:WOUND CARE SUPPLIES AND COLOSTOMY SUPPLY ORDER: HYDROFIBER WITH SILVER/6X6/2 BOXES/CONFIRMATION #[id number].  SEE INTERVENTIONS.  Reviewed importance of calling the nurse first.  PATIENT VERBALIZED UNDERSTANDING.

## 2019-11-06 ENCOUNTER — Encounter: Primary: Pulmonary Disease

## 2019-11-07 ENCOUNTER — Encounter

## 2019-11-08 ENCOUNTER — Inpatient Hospital Stay: Admit: 2019-11-08 | Payer: MEDICAID | Attending: Pulmonary Disease | Primary: Pulmonary Disease

## 2019-11-08 DIAGNOSIS — Z01812 Encounter for preprocedural laboratory examination: Secondary | ICD-10-CM

## 2019-11-09 LAB — COVID-19, NP
SARS-CoV-2: NOT DETECTED
SARS-CoV-2: NOT DETECTED

## 2019-11-13 ENCOUNTER — Inpatient Hospital Stay: Admit: 2019-11-13 | Payer: MEDICAID | Attending: Pulmonary Disease | Primary: Pulmonary Disease

## 2019-11-13 ENCOUNTER — Encounter: Primary: Pulmonary Disease

## 2019-11-13 DIAGNOSIS — Z86711 Personal history of pulmonary embolism: Secondary | ICD-10-CM

## 2019-11-13 MED ORDER — ALBUTEROL SULFATE 0.083 % (0.83 MG/ML) SOLN FOR INHALATION
2.5 mg /3 mL (0.083 %) | RESPIRATORY_TRACT | Status: AC
Start: 2019-11-13 — End: 2019-11-13
  Administered 2019-11-13: 15:00:00 via RESPIRATORY_TRACT

## 2019-11-13 MED FILL — ALBUTEROL SULFATE 0.083 % (0.83 MG/ML) SOLN FOR INHALATION: 2.5 mg /3 mL (0.083 %) | RESPIRATORY_TRACT | Qty: 1

## 2019-11-13 NOTE — Progress Notes (Signed)
Patient here for PFTs  Vents Volumes and Diffusion done. Patient posted with Albuterol via aerogen nebx's 10 mins

## 2019-11-25 ENCOUNTER — Encounter: Admit: 2019-11-25 | Discharge: 2019-11-25 | Payer: MEDICAID | Primary: Pulmonary Disease

## 2019-11-25 NOTE — Home Health (Signed)
 PATIENT IS AWAKE, ALERT, AND ORIENTED TIMES 3.  NO RESP DISTRESS NOTED. VITAL SIGNS STABLE AFEBRILE, LUNGS CLEAR BILATERALLY.  NO LOWER EXTREMITY EDEMA NOTED.   PATIENT REPORT JOINT PAIN AND HAS TAKING 1 TAB OF TYLENOL 500MG  LAST NIGHT FOR A TOTAL OF 2 TABS WITHIN THE PAST 24 HOURS.    PATIENT REPORT JOINT PAIN, VITAL SIGNS STABLE AFEBRILE.    PATIENT IS NOW ON LOVENOX 100 MG/ML SUBCU 2 TIMES A DAY AND ANASTROZOLE 1 MG BY MOUTH DAILY ORDERED BY MD Mamorska-Dyga, Aleksandra, MD [phone]/ONCOLOGIST.  ELIQUIS WAS DISCONTINUED BY MD MAMORSKA-DYGA/ONCOLOGIST.    CHN CALLED MD Melanee Ligas, MD AND SPOKE WITH NURSE JILL/RN REPORTED THE JOINT PAIN, FACE DRAWN AND WEAKNESS  [facility] AND PATIENT'S PHONE NUMBER LEFT WITH PATIENT FOR MD TO CALL BACK.  10:33 AM MD MAMORSKA CALLED BACK AND ALL SYMPTOMS REPORTED TO MD AND AS PER MD IT IS LIKELY DUE TO NEW MEDS LOVENOX, AND ANASTROZOLE IT IS LIKELY CAUSING THE JOINT PAIN.  AS PER MD MAMORSKA TO STOP THE ANASTROZOLE FOR 2 TO 3 DAYS AND FOR PATIENT TO REPORT TO HER IF SYMPTOMS HAVE SUBSIDES.  PATIENT VERBALIZED UNDERSTANDING.     PATIENT IS SCHEDULE WITH MD MITCHELLSURGEON TO HAVE SURGERY FOR NEXT WEEK FOR OSTOMY REVERSAL.    CHN INSTRUCTED PATIENT ON SN DISCHARGE.  PATIENT VERBALIZED UNDERSTANDING.   CHN NOTED TOP ABDOMINAL WOUND IS CLOSED AND BOTTOM ABDOMINAL WOUND IS DRY AND DRESSING IS DRY AND INTACT.   PATIENT IS COMPLIANT AND INDEPENDENT WITH DRY DRESSING.    PATIENT DENIES DISCOMFORT AT SITE.  NO SIGN AND SYMPTOMS OF INFECTION NOTED.   OSTOMY SITE CLEAN DRY AND INTACT WITH NO SIGN AND SYMPTOMS OF INFECTON.  PATIENT REPORT CURRENT OSTOMY SUPPLIES ARE WORKING WELL AND ISSUES AT THIS TIME.   PATIENT IS COMPLIANT AND INDEPENDENT WITH OSTOMY CARE.   SEE INTERVENTIONS.   Reviewed importance of calling the nurse first.  PATIENT VERBALIZED UNDERSTANDING.

## 2020-02-17 ENCOUNTER — Encounter: Payer: MEDICAID | Attending: Cardiovascular Disease | Primary: Pulmonary Disease

## 2020-03-24 ENCOUNTER — Inpatient Hospital Stay: Admit: 2020-03-24 | Payer: MEDICAID | Attending: Hematology & Oncology | Primary: Pulmonary Disease

## 2020-03-24 ENCOUNTER — Encounter: Attending: Cardiovascular Disease | Primary: Pulmonary Disease

## 2020-03-24 ENCOUNTER — Encounter

## 2020-03-24 DIAGNOSIS — Z20822 Contact with and (suspected) exposure to covid-19: Secondary | ICD-10-CM

## 2020-03-25 LAB — COVID-19, NP
SARS-CoV-2: NOT DETECTED
SARS-CoV-2: NOT DETECTED

## 2020-07-10 IMAGING — US US LOW EXT VEINS BILAT
1 series · 13 of 16 positions shown · non-contrast
Comparison: none

FINAL REPORT:
Exam:  Bilateral lower extremity venous duplex.
REASON FOR EXAM: Evaluate for deep vein thrombosis.
Results:
Right lower extremity:
Grayscale imaging, the right common femoral vein is compressible. The deep femoral vein is compressible. The femoral vein throughout its compressible. The popliteal vein is compressible. The peroneal posterior tibial vein are both compressible. On color Doppler, the right common femoral vein is patent with evidence respiratory variation. The deep femoral vein is patent with evidence respiratory variation. The femoral vein throughout demonstrates augmentation and is patent. The popliteal vein demonstrates augmentation and is patent. The peroneal posterior tibial vein both appear to be patent.
Left lower extremity:
On grayscale imaging, left common femoral vein is compressible. The deep femoral vein is compressible. The femoral vein throughout its compressible. The popliteal vein is compressible. The posterior tibial and peroneal vein are both compressible. On color Doppler, left common femoral vein is patent with evidence of respiratory variation. The deep femoral vein is patent with evidence respiratory variation. The femoral vein throughout is patent and demonstrates augmentation. The popliteal vein is patent and demonstrates augmentation. The posterior tibial and peroneal veins both demonstrate flow.

[Series 1: us low ext veins bilat · 13 of 38 slices shown]
[im 1/38]
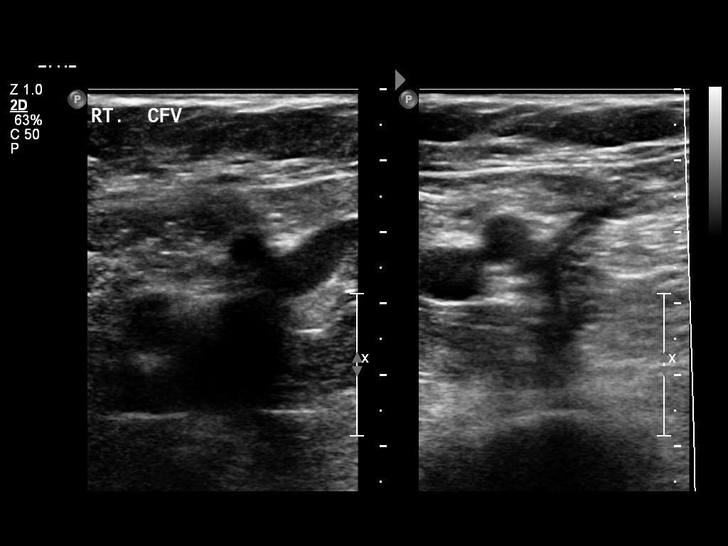
[im 3/38]
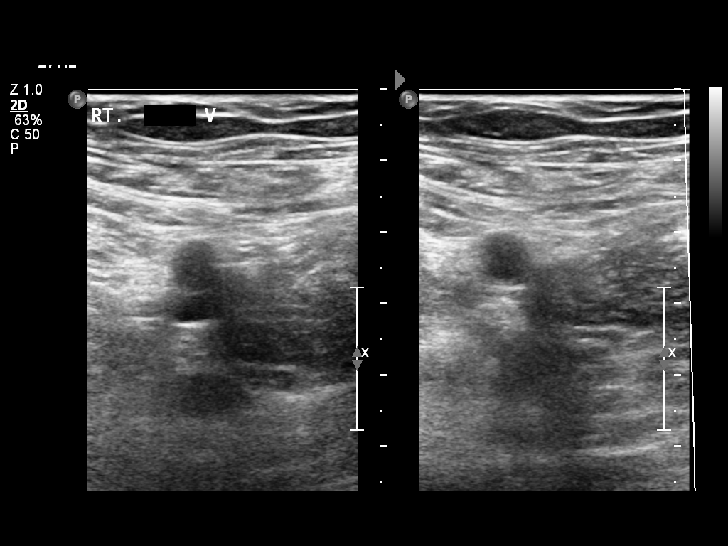
[im 8/38]
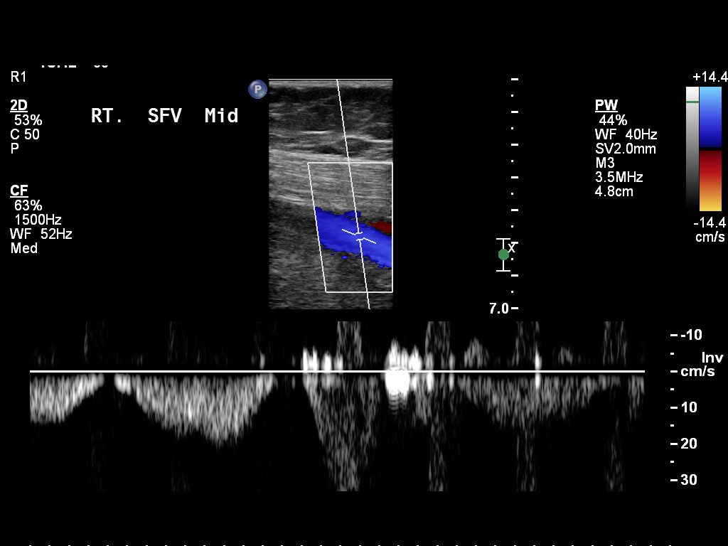
[im 10/38]
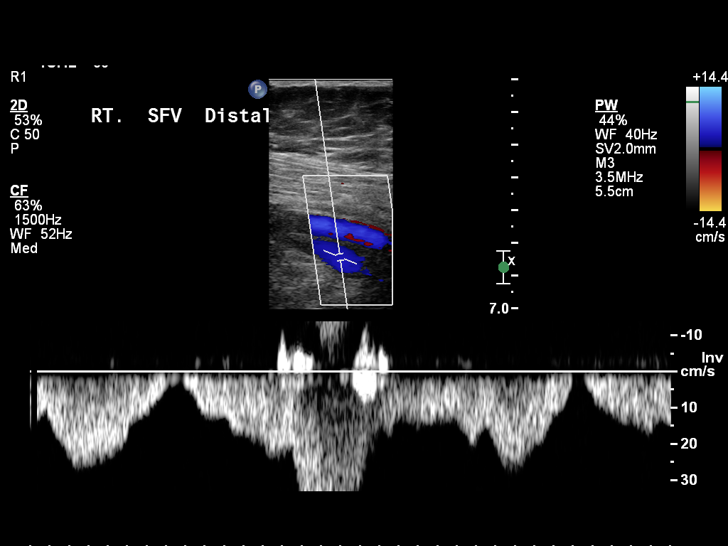
[im 13/38]
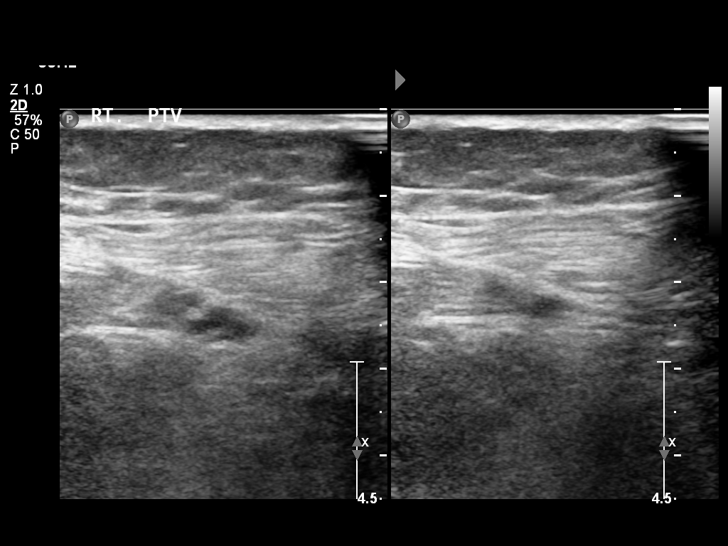
[im 15/38]
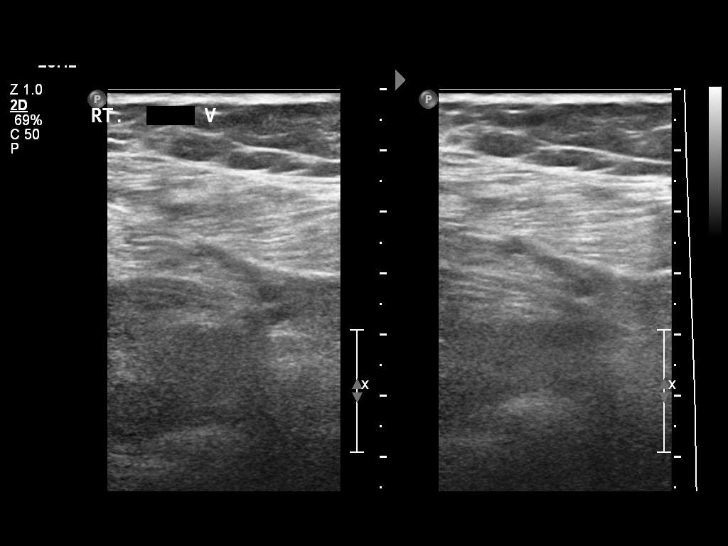
[im 20/38]
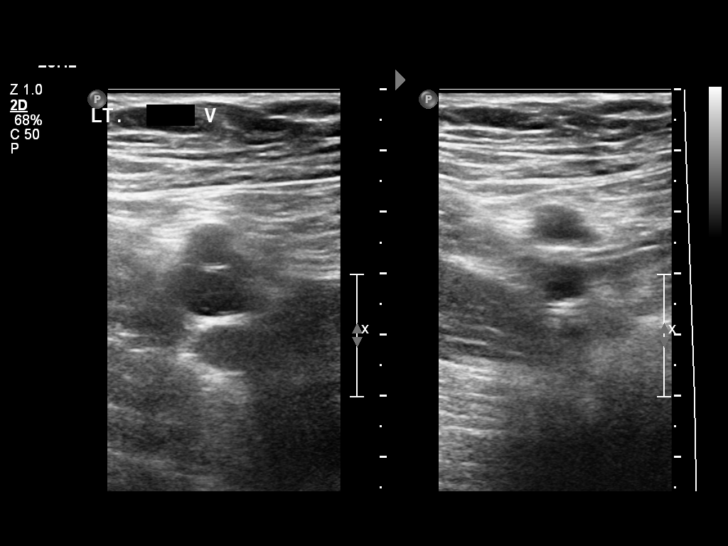
[im 23/38]
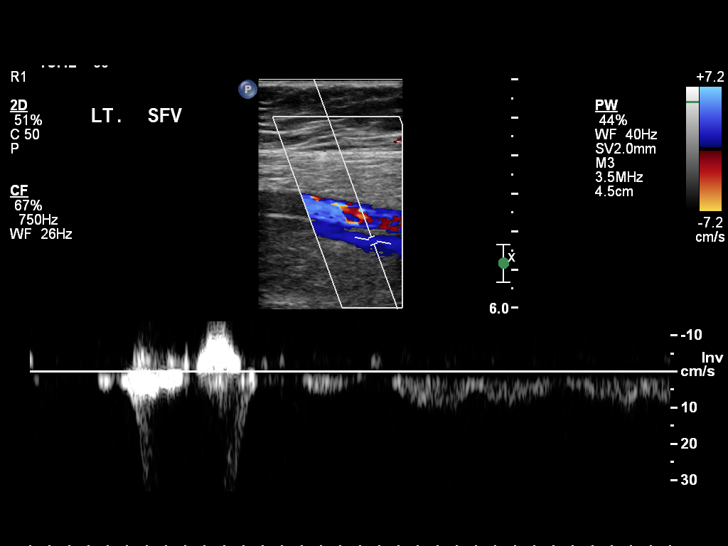
[im 25/38]
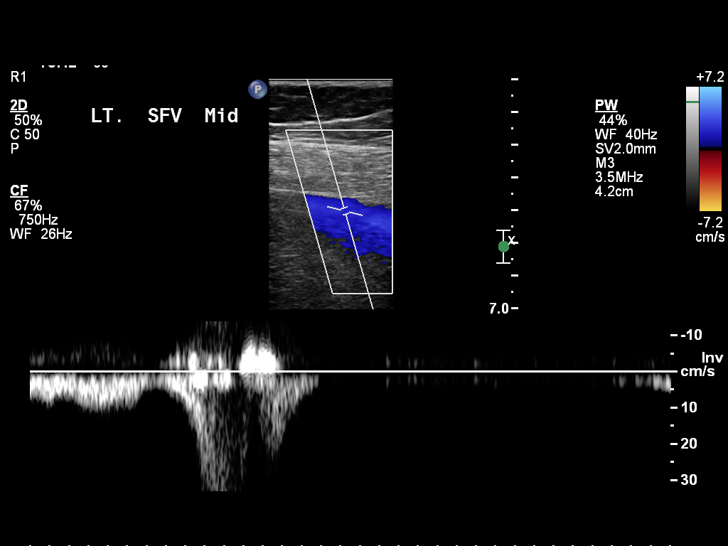
[im 28/38]
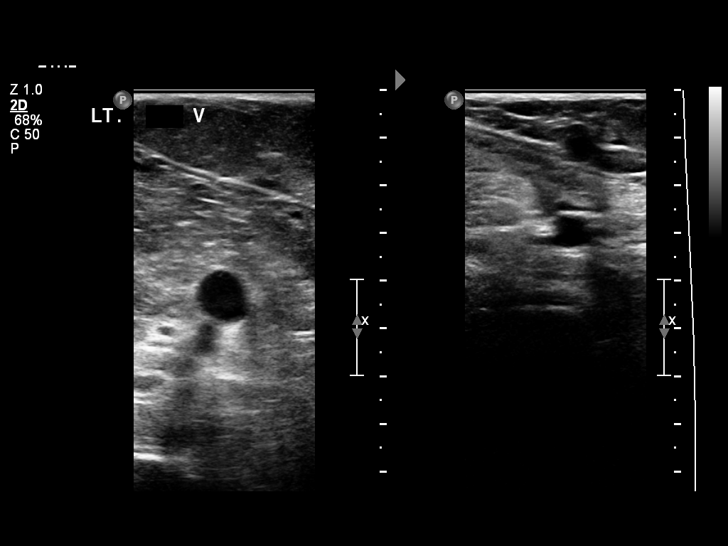
[im 30/38]
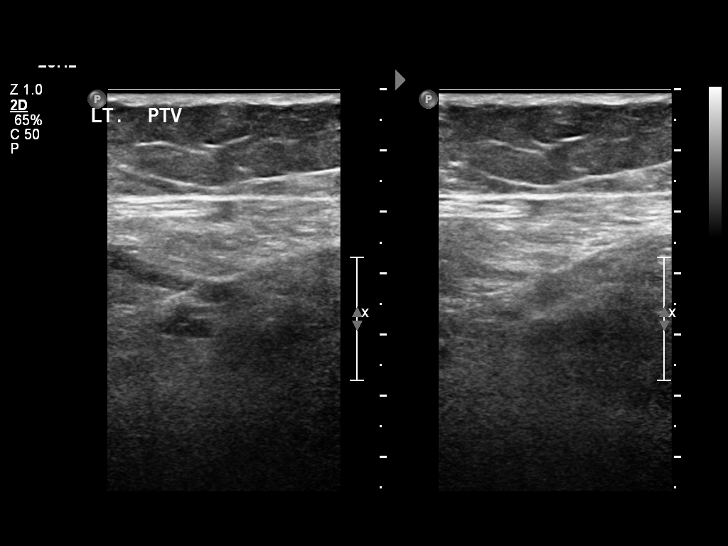
[im 35/38]
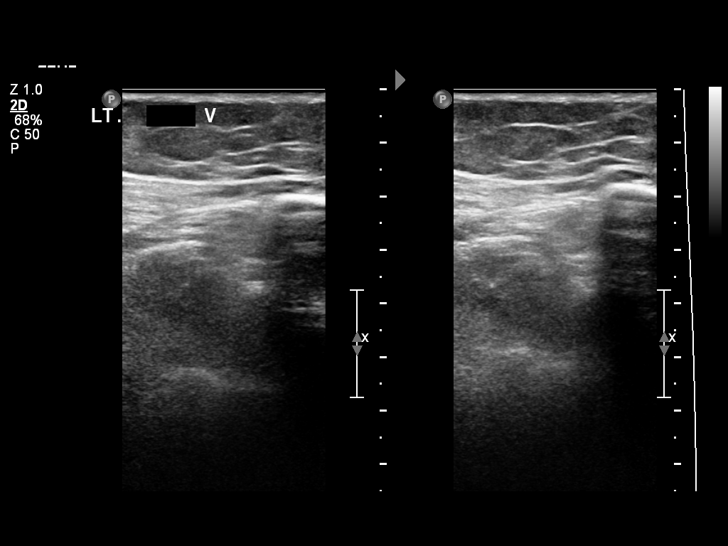
[im 38/38]
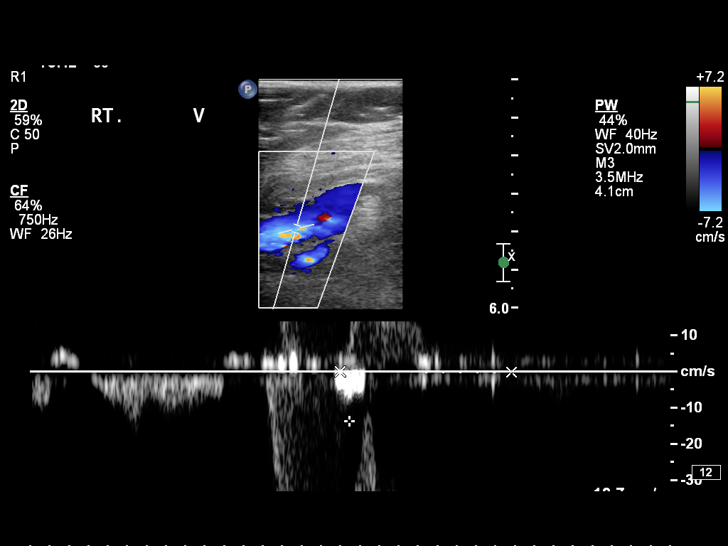

[13 of 16 positions shown; findings below may reference images not displayed]

IMPRESSION: No evidence of deep vein thrombosis bilaterally.
Is the patient pregnant?
Unknown

## 2020-07-15 IMAGING — CT CT ABDOMEN PELVIS WITH CONTRAST
2 of 9 series · 12 of 46 positions shown, 18 images · IV contrast (agent unspecified)
Comparison: No prior.

FINAL REPORT:
CT ABDOMEN AND PELVIS WITH CONTRAST
INDICATION: Malignant neoplasm of upper-outer quadrant of right female breast

[Series 5: with · axial · 0.86mm/px · z∈[-592,-247]mm · 9 of 170 slices shown, 15 images]
[im 16/170  soft-tissue]
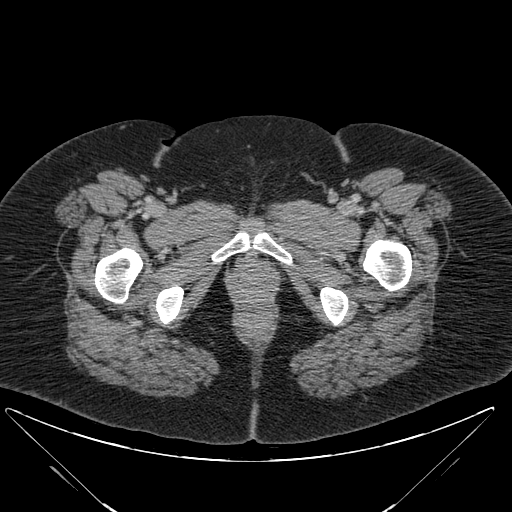
[im 16/170  bone]
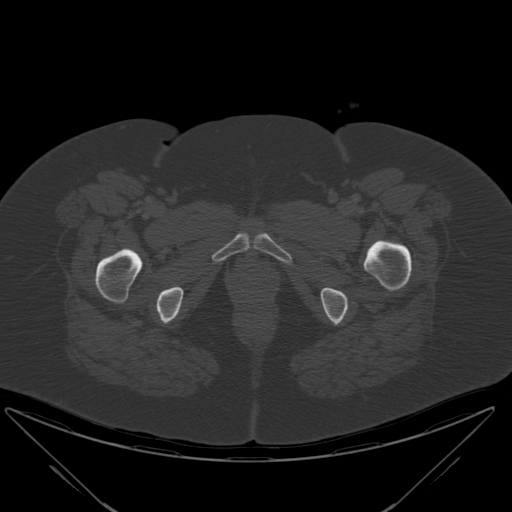
[im 31/170  soft-tissue]
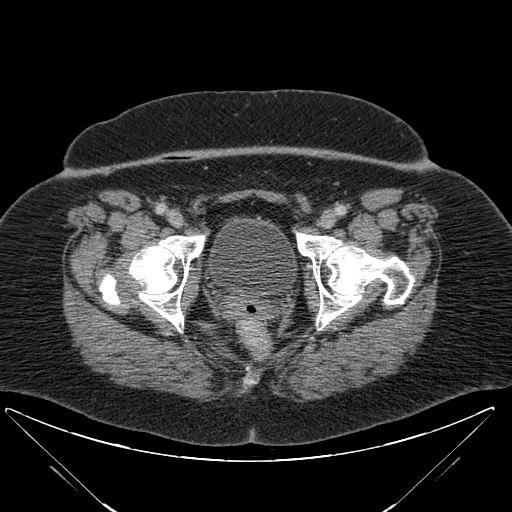
[im 47/170  soft-tissue]
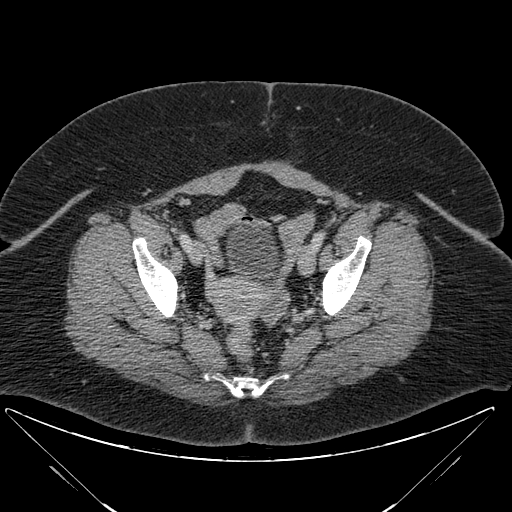
[im 62/170  soft-tissue]
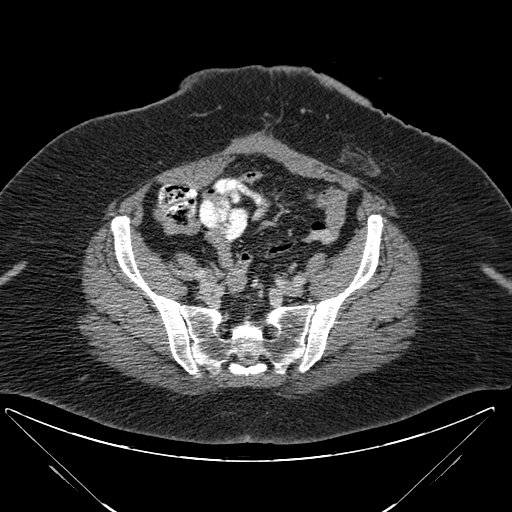
[im 93/170  soft-tissue]
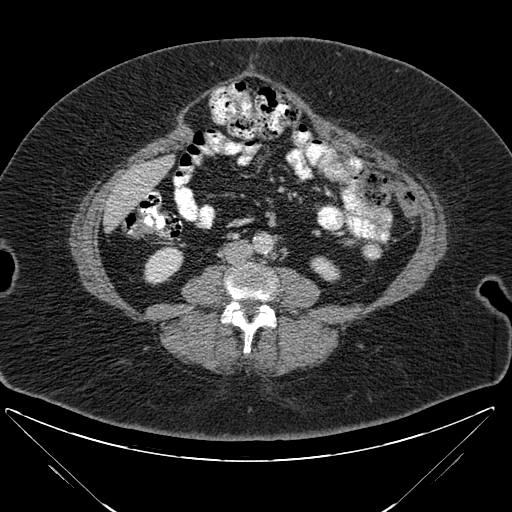
[im 108/170  soft-tissue]
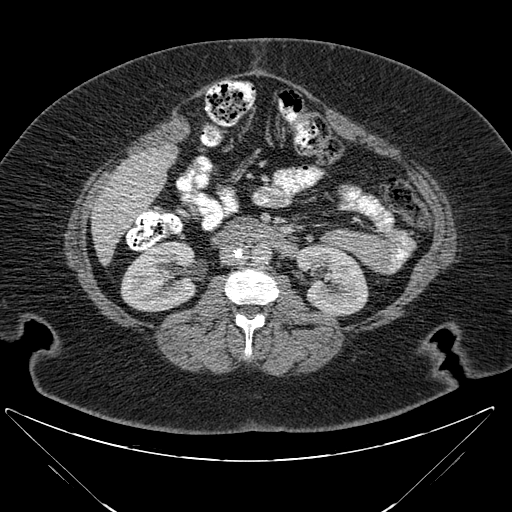
[im 108/170  lung]
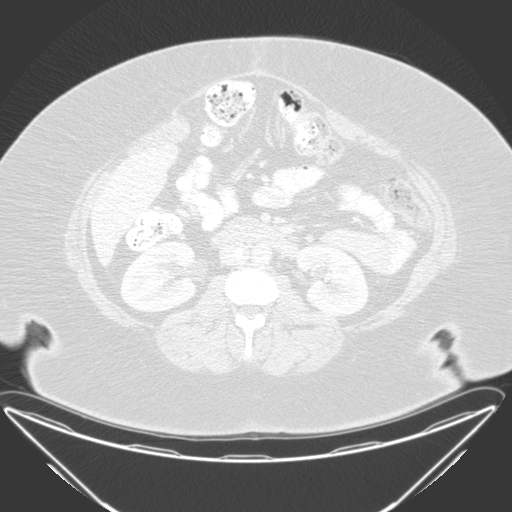
[im 123/170  soft-tissue]
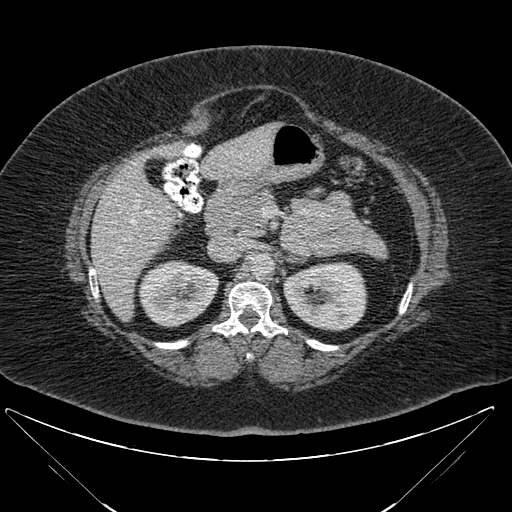
[im 123/170  lung]
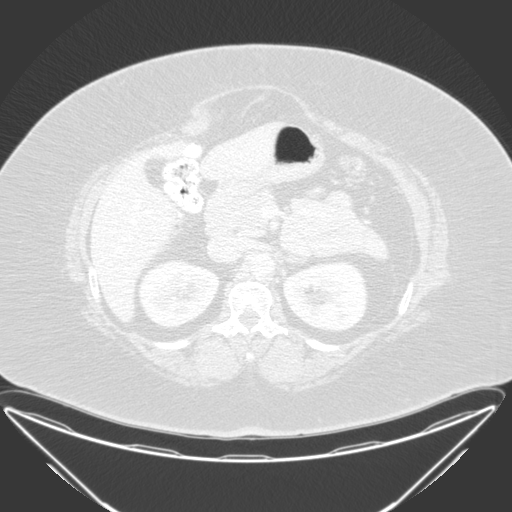
[im 139/170  soft-tissue]
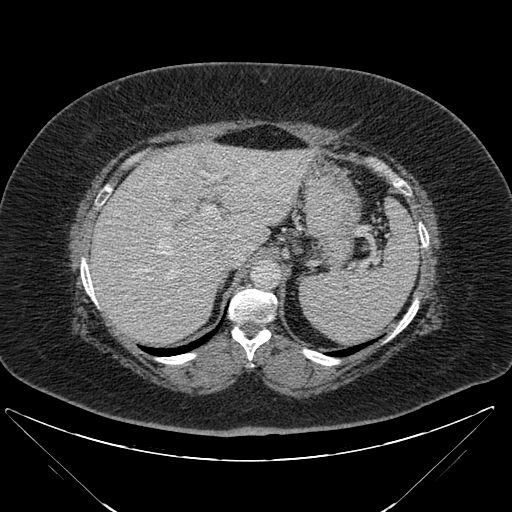
[im 139/170  lung]
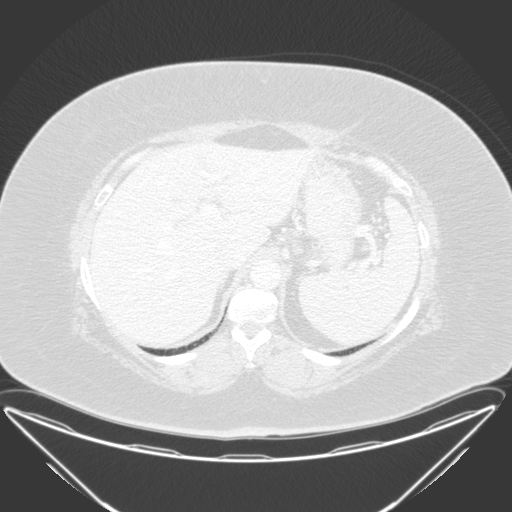
[im 154/170  soft-tissue]
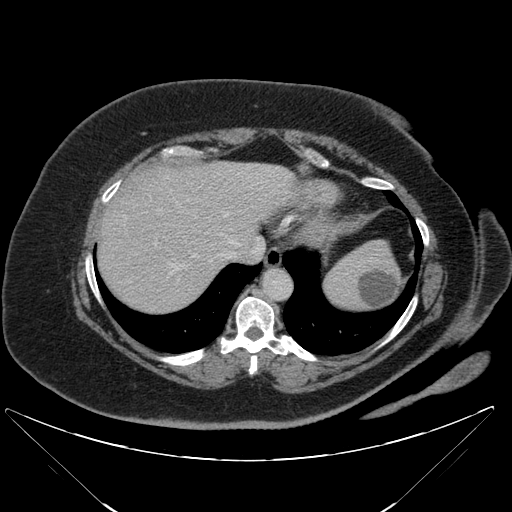
[im 154/170  lung]
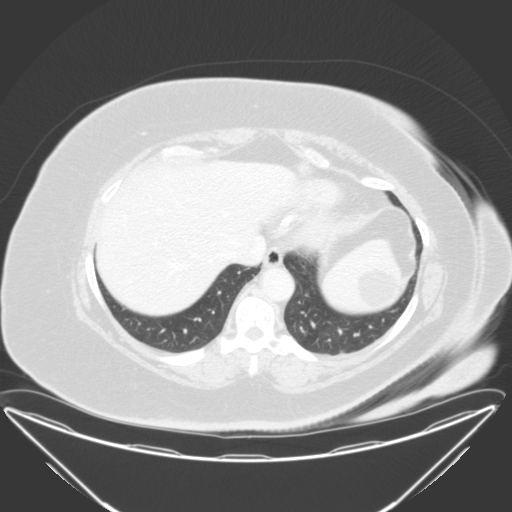
[im 154/170  bone]
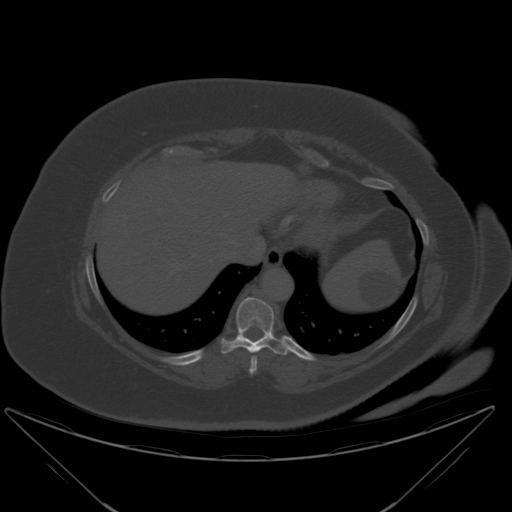

[mpr, cor, coronal · coronal · 0.71mm/px · 3 of 126 slices shown]
[im 32/126  soft-tissue]
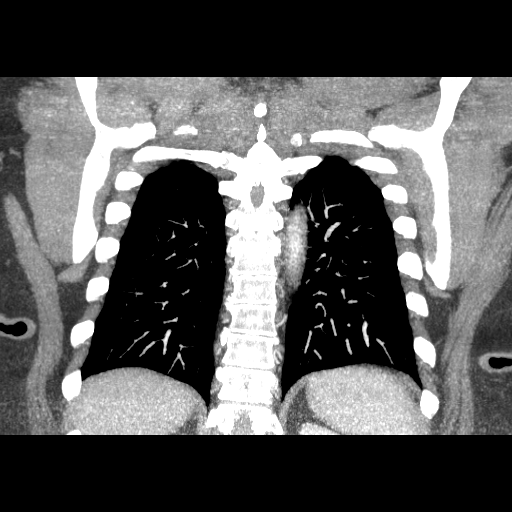
[im 63/126  soft-tissue]
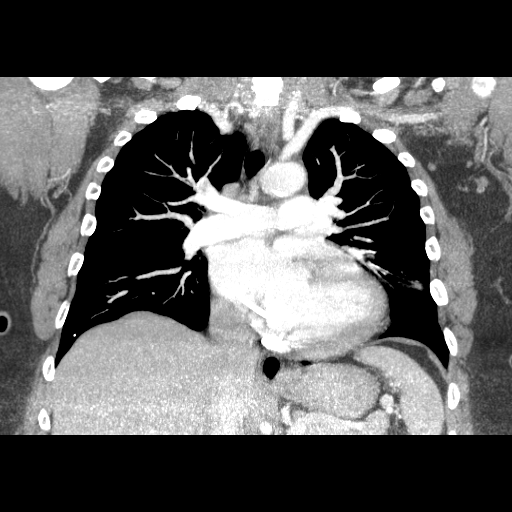
[im 94/126  soft-tissue]
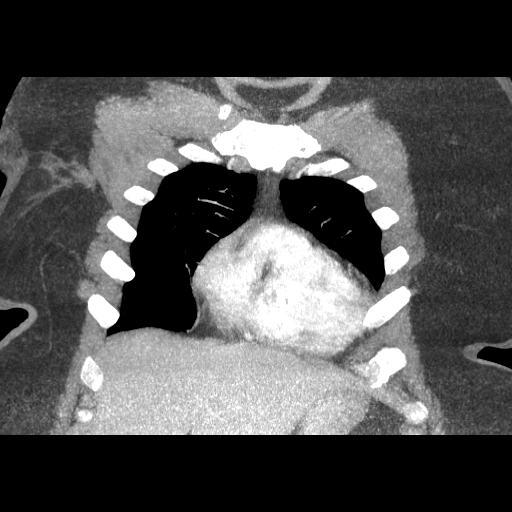

[12 of 46 positions shown; findings below may reference images not displayed]

PROCEDURE:
Informed consent for contrast was obtained. Postcontrast axial CT images were acquired. Coronal and sagittal reconstruction images were generated and reviewed. Oral contrast also administered.
FINDINGS: The exam is significantly degraded by diffuse beam hardening artifact due to body habitus.
The liver contains well-circumscribed hypodensities likely representing cysts measuring up to 2.3 cm. The central right hepatic lobe there is vague indistinct curvilinear region of subtle enhancement. Favor this to represent an incidental portal venous shunt.
The postcholecystectomy biliary tree, pancreas and bilateral adrenal glands are unremarkable. The spleen contains a few well-circumscribed hypodensities suggestive of cysts measuring up to 4.1 cm and 1.7 cm. No evidence for abdominal or pelvic lymphadenopathy, by size criteria.
Well-positioned infrarenal IVC filter noted. Normal caliber abdominal aorta with minimal aortoiliac atherosclerotic changes. The bilateral kidneys exhibit symmetric cortical renal enhancement without evidence of hydronephrosis. There are a few left renal stones noted measuring up to 4 mm.
Uterus and urinary bladder unremarkable. Left lower quadrant colostomy is noted. No bowel obstruction. No evidence for pericolonic inflammatory change. Normal caliber appendix. No ascites or pneumoperitoneum. No free pelvic fluid.
Minimal degenerative changes in the spine. No CT evidence for suspicious osseous lesions. There is diastases of the abdominal rectus muscles with broad based trusion of the mesenteric fat and upper abdominal loops into this protrusion. Bowel sutures are also seen in the mid transverse colon.
IMPRESSION: No convincing evidence for metastatic disease seen in the abdomen or pelvis.
There are several nonspecific findings, which are favored to represent benign findings and attention on follow-up exams is recommended. These include hypodensities in the spleen and liver likely representing cysts as well as vague focus of subtle curvilinear enhancement in the right hepatic lobe felt to represent a portal venous shunt. Severe beam hardening artifact throughout the exam, secondary to body habitus, limits spatial resolution and overall evaluation.
Location code: 1
Is the patient pregnant?
Unknown

## 2020-08-05 IMAGING — US US ABDOMEN LIMITED
1 series · 14 of 16 positions shown · non-contrast
Comparison: none

FINAL REPORT:
HISTORY: Pain in right flank
PROCEDURE: Superficial soft tissue ultrasound of the right flank
TECHNIQUE: Multiple longitudinal and transverse ultrasound images of the right flank were obtained

[Series 1: us abdomen limited · 14 of 16 slices shown]
[im 1/16]
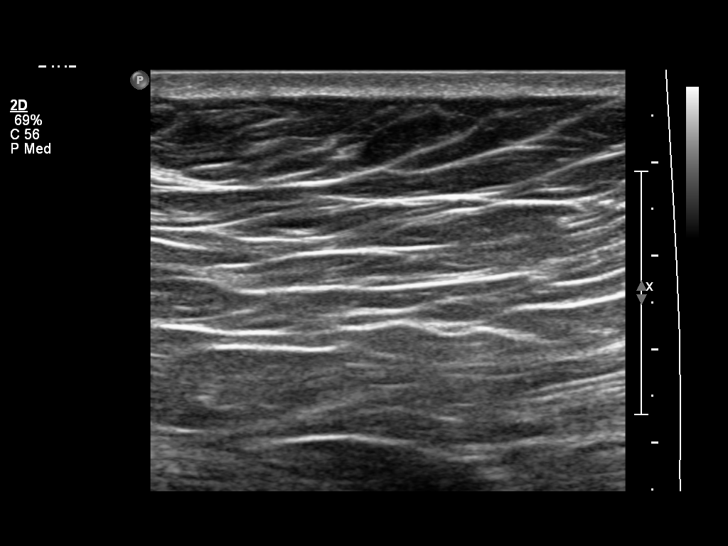
[im 2/16]
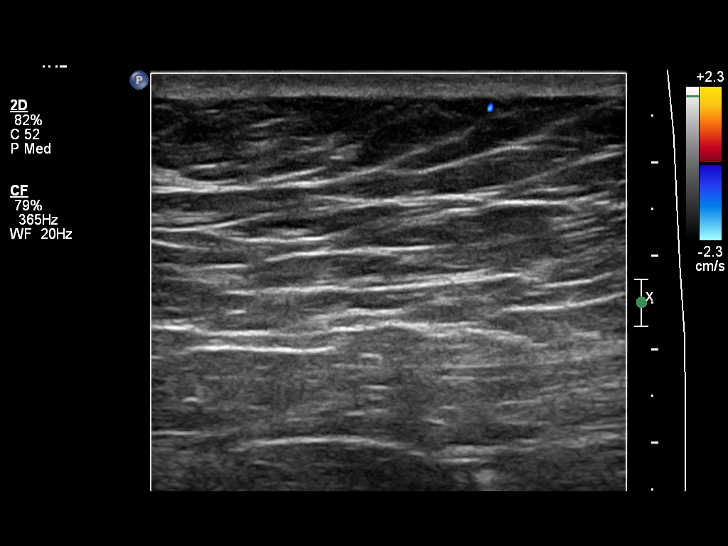
[im 3/16]
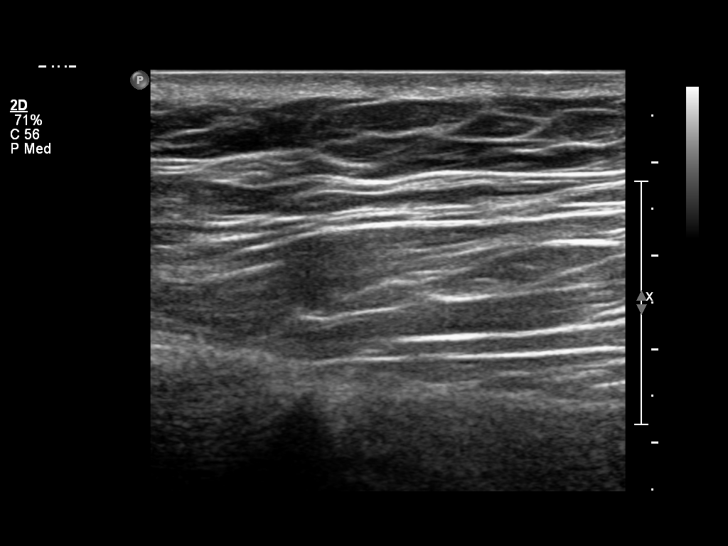
[im 5/16]
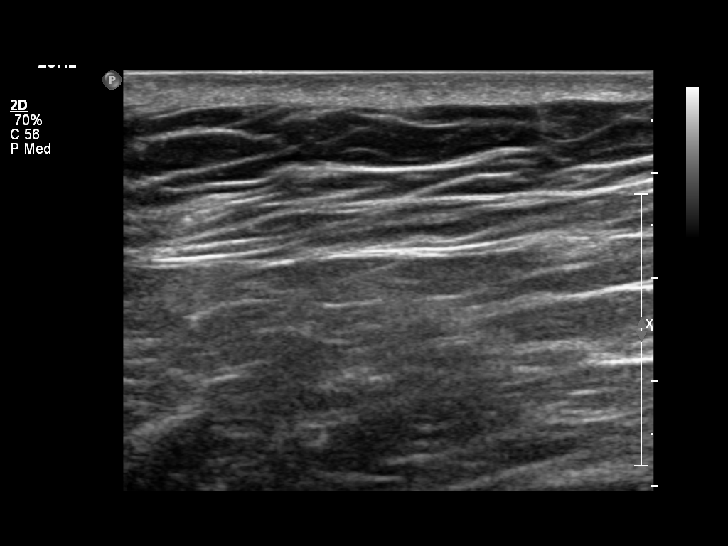
[im 6/16]
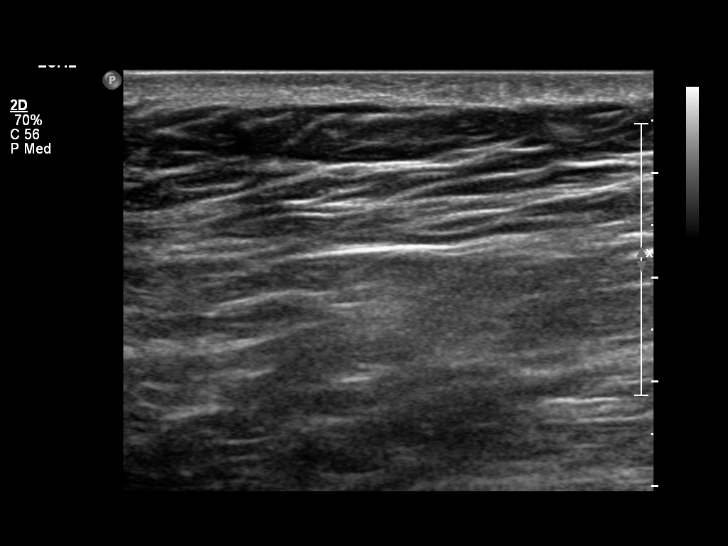
[im 7/16]
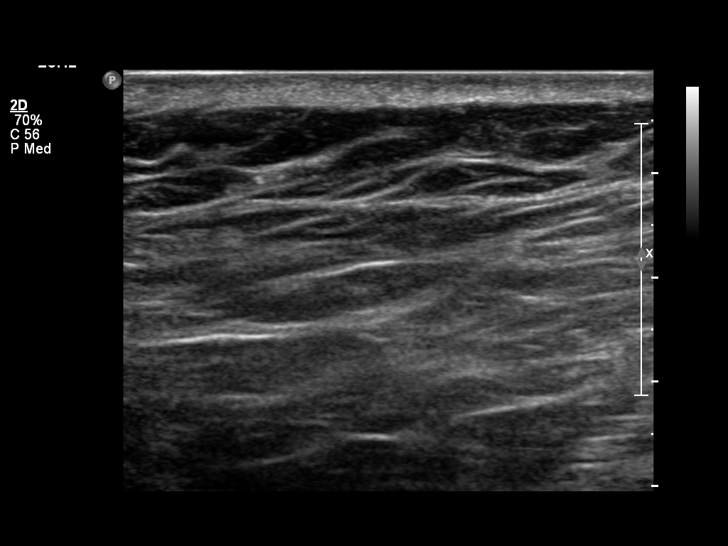
[im 8/16]
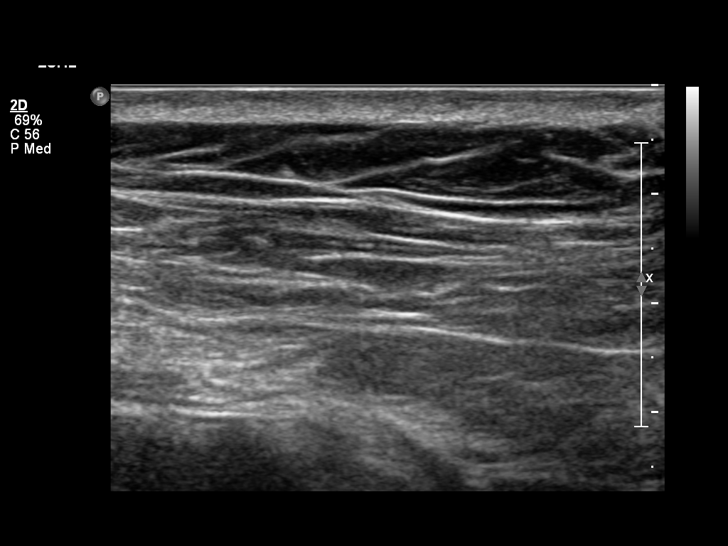
[im 9/16]
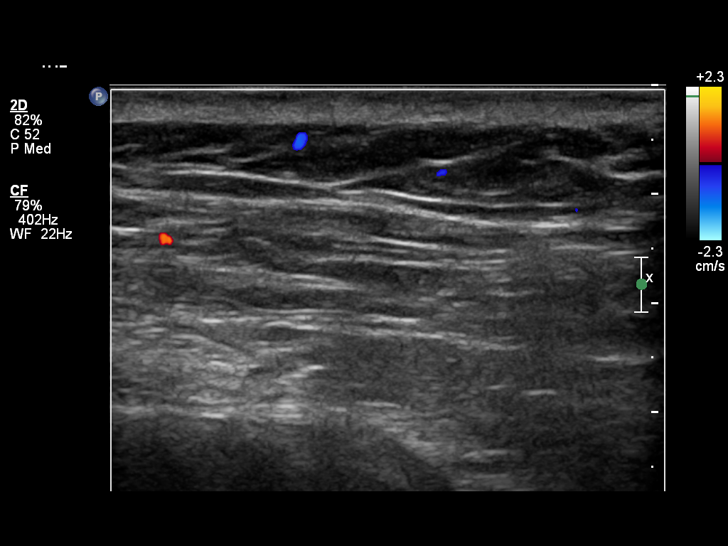
[im 10/16]
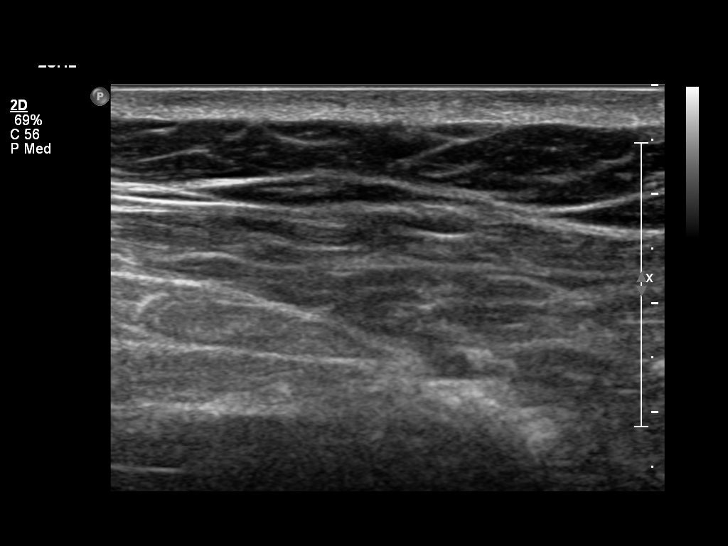
[im 11/16]
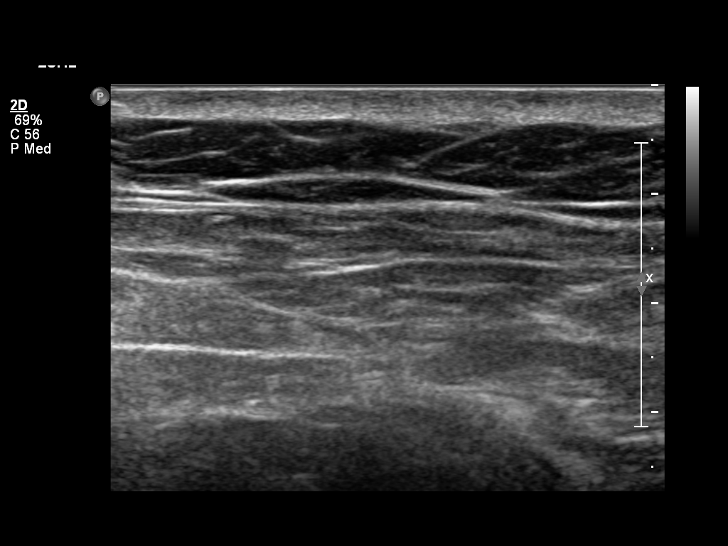
[im 13/16]
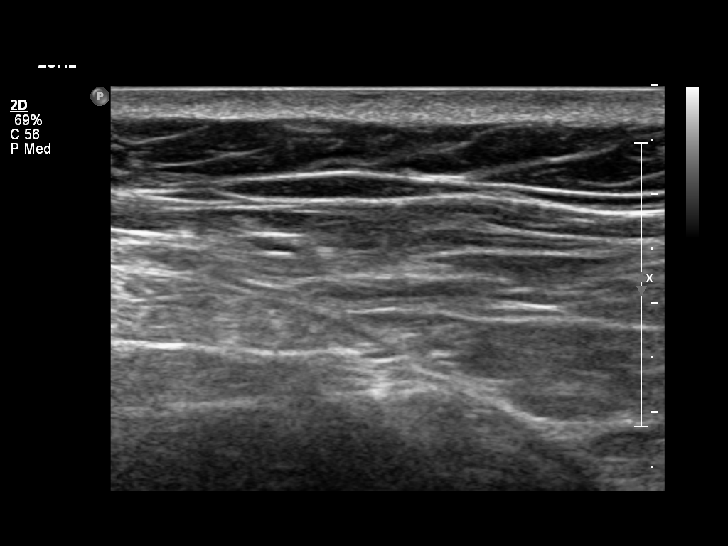
[im 14/16]
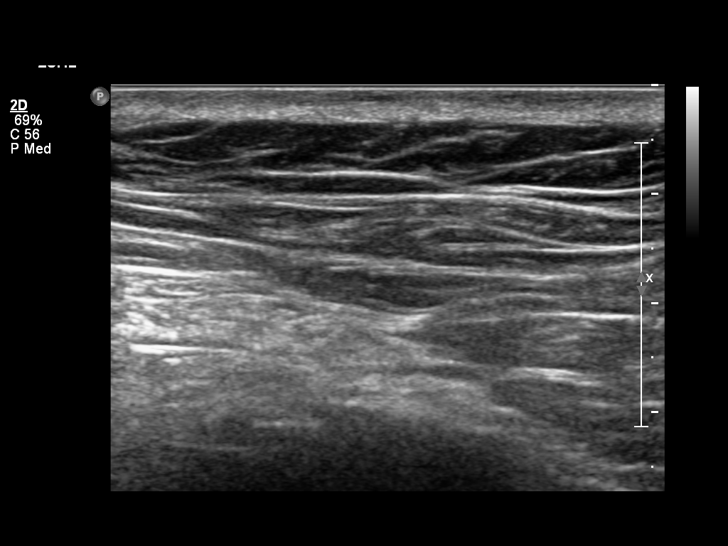
[im 15/16]
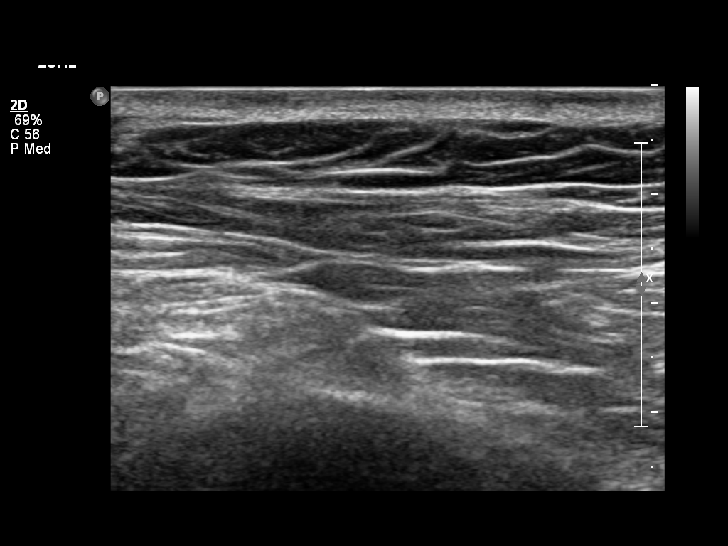
[im 16/16]
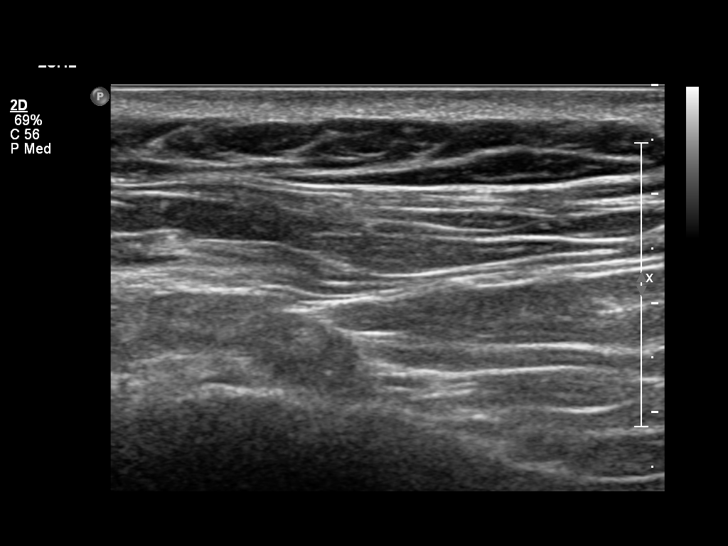

[14 of 16 positions shown; findings below may reference images not displayed]

FINDINGS: No focal mass. No soft tissue fluid collection. No abnormal echoes.
IMPRESSION: No focal findings seen sonographically

## 2020-12-22 IMAGING — MG MAMMO BREAST DIAGNOSTIC TOMOSYNTHESIS LEFT
8 of 9 series · 8 of 17 positions shown · non-contrast
Comparison: 01/20/2020

This is a summary report. The complete report is available in the patient's medical record. If you cannot access the medical record, please contact the sending organization for a detailed fax or copy.
FINAL REPORT:
EXAM: MAMMO BREAST DIAGNOSTIC TOMOSYNTHESIS LEFT
CLINICAL HISTORY: History of breast cancer status post right mastectomy. No complaints.
TECHNIQUE: True lateral, MLO and CC views with tomosynthesis of the left breast were obtained on a digital full-field mammographic unit. This examination was interpreted in conjunction with computer aided detection system.

[L MLO (1 of 2)]
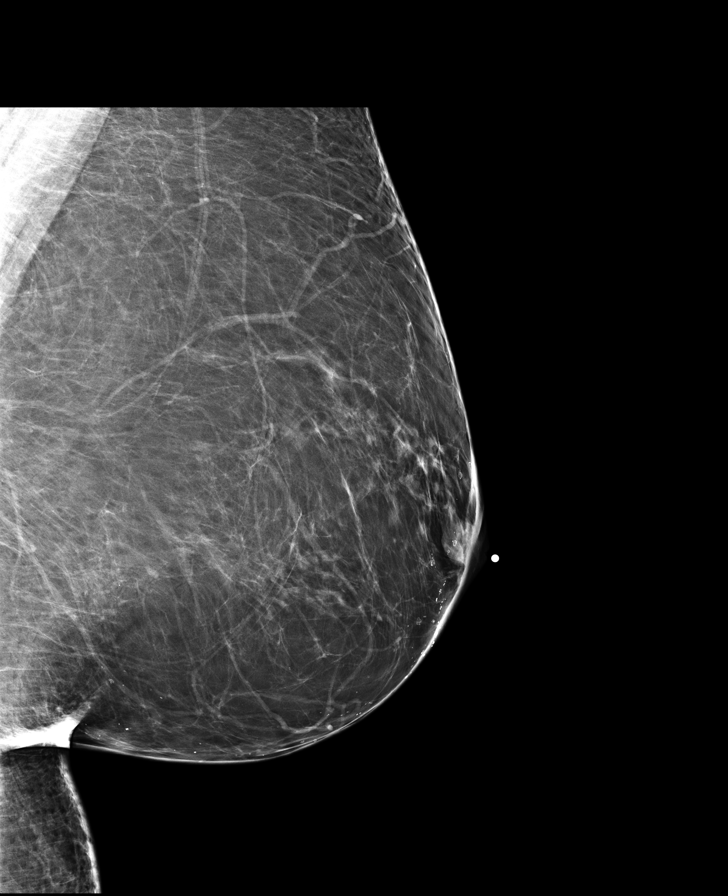

[L ML]
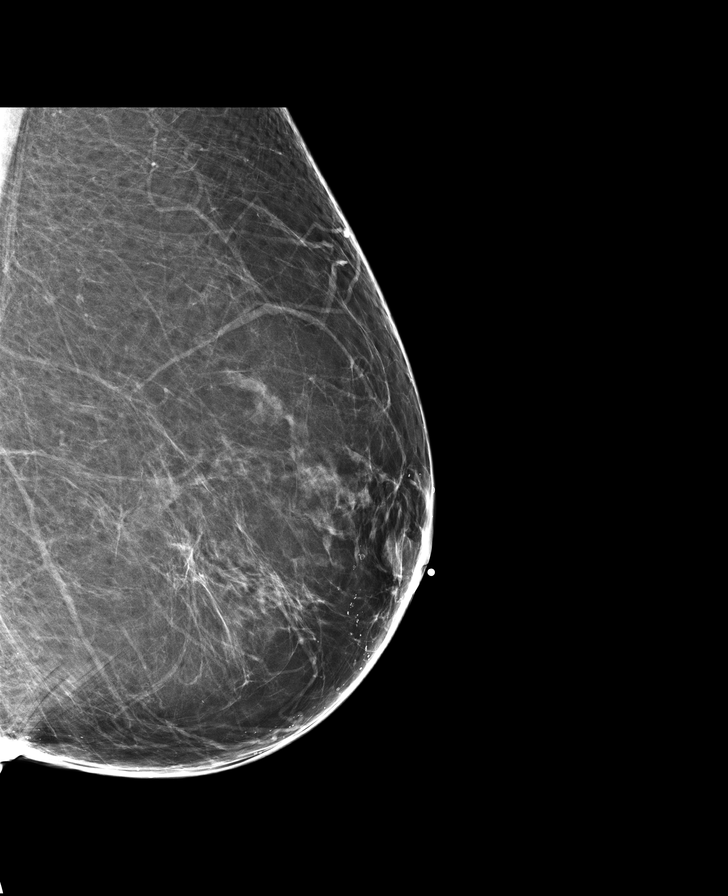

[L XCCL]
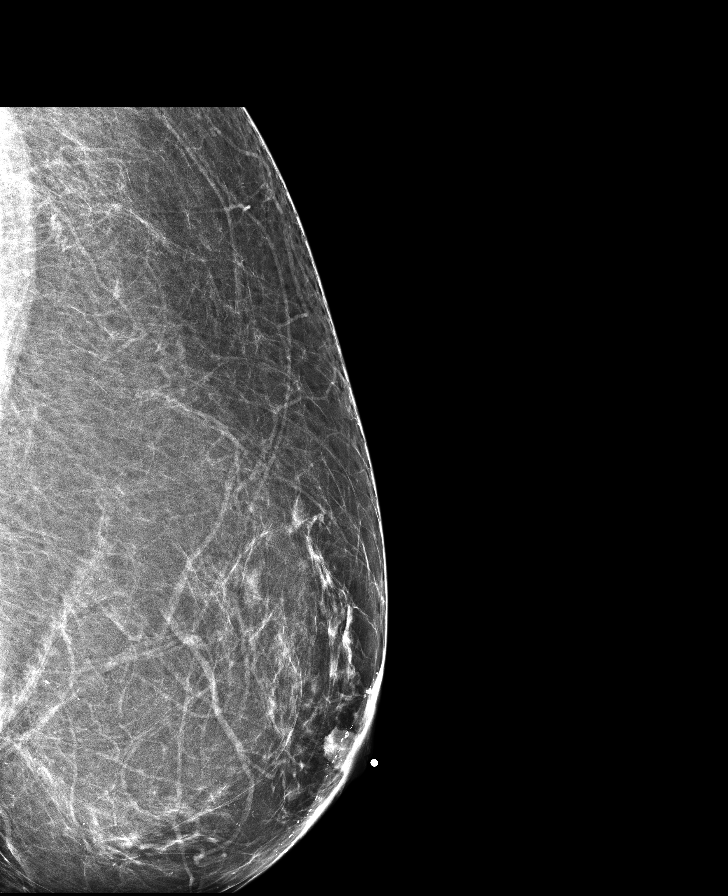

[L CC]
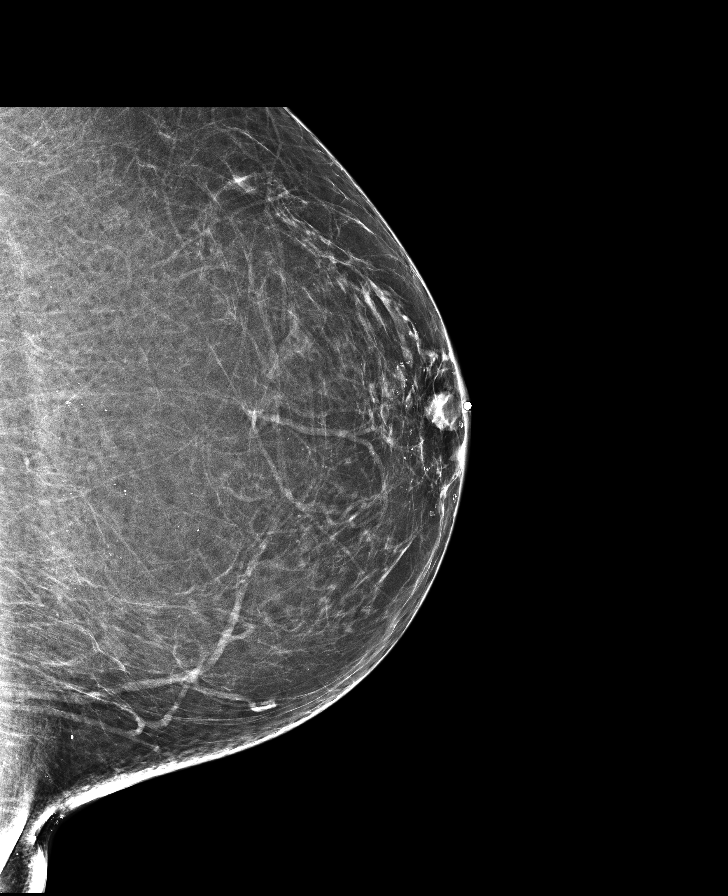

[L CC synth-2D]
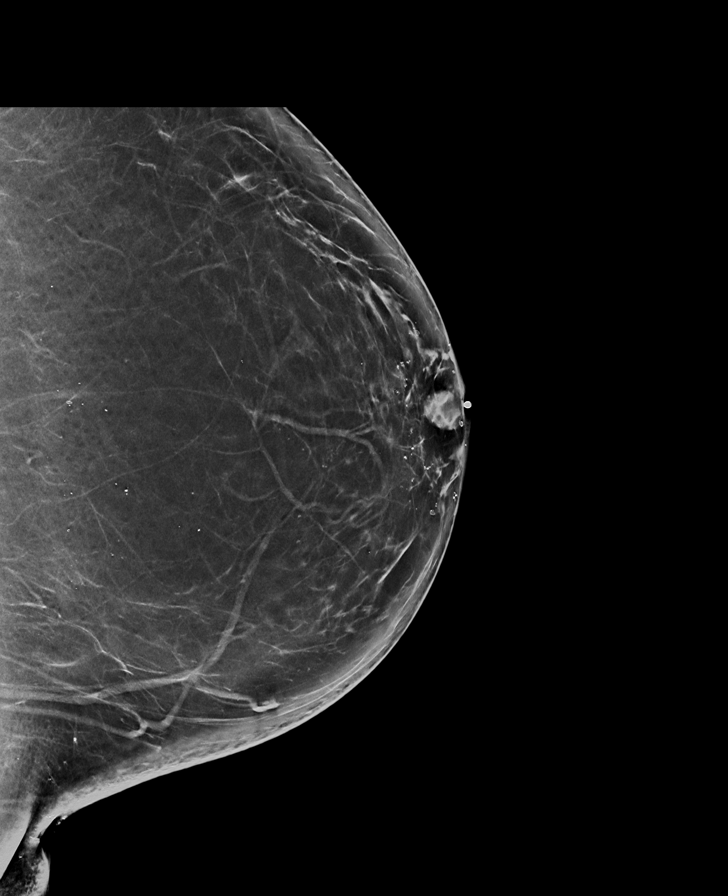

[L MLO (2 of 2)]
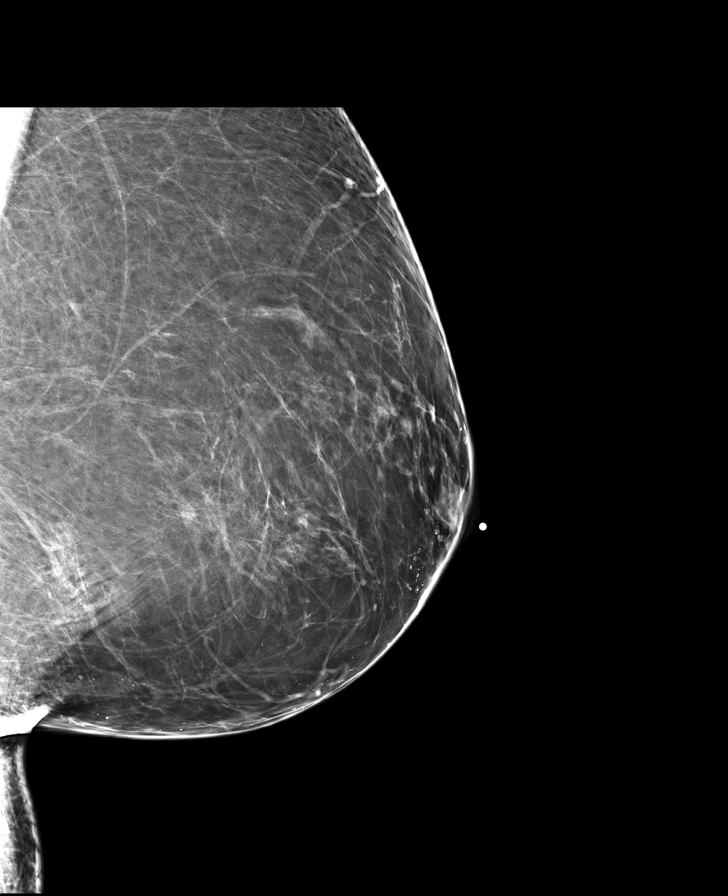

[L MLO synth-2D]
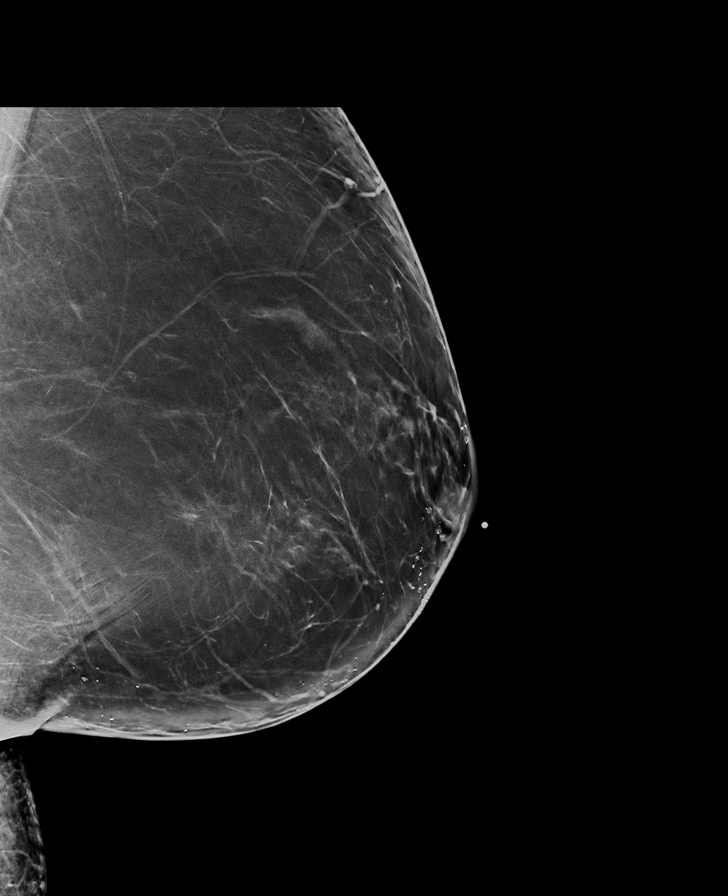

[L MLO tomo · tomo slice 45/88.0]
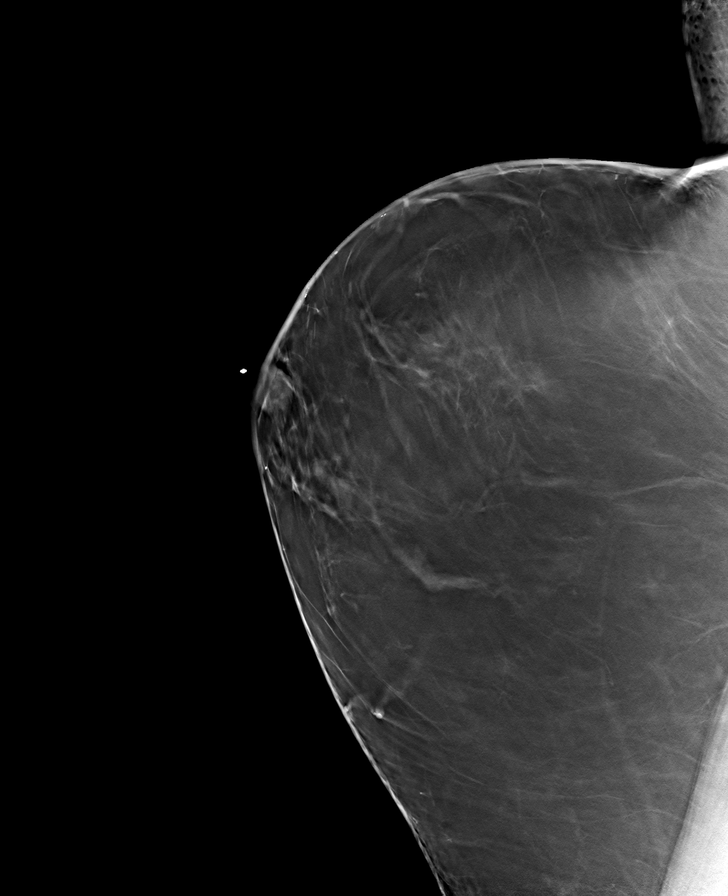

[8 of 17 positions shown; findings below may reference images not displayed]

FINDINGS: Breast density: There are scattered fibroglandular densities (B).
Patient is status post right mastectomy.
In the left breast, there has been no significant interval change since prior exam.  There are stable scattered benign-appearing calcifications. No suspicious mass, cluster of calcifications, or other abnormality is identified.
IMPRESSION: No mammographic evidence of malignancy.
BI-RADS Category 2: Benign
Recommendation(s): Screening mammogram in one year.
Is the patient pregnant?
No

## 2020-12-28 IMAGING — MR MRI CARDIAC MORPHOLOGY AND FUNCTION WITH AND WITHOUT IV CONTRAST
46 of 48 series · 46 of 48 positions shown · IV contrast (prohance)
Comparison: none

2ND ATTEMPT, PT MEDICATION AND FALLING ASLEEP DURING TEST
FINAL REPORT:
ID:  55-year-old woman with severe left ventricular hypertrophy on echocardiogram, presents for cardiac MRI to evaluate for hypertrophic cardiomyopathy or infiltrative cardiomyopathy.
Exam:
1. Cardiac MRI with and without contrast
2. Velocity-flow imaging
TECHNIQUE: Siemens 1.5T MRI scanner used. Black blood and bright blood imaging used in axial and coronal planes respectively for localization and anatomic definition.  Cine imaging of cardiac structures used for evaluation of function, structure, and flow. Velocity-flow imaging of the aortic valve for flow and volume quantification. Delayed enhancement imaging techniques used both with short and long TIs.  30 cc of ProHance gadolinium contrast utilized. Image quality excellent.  Study analyzed on [employer] [id number] workstation.

[Series 2: (email) · sagittal · 8.0mm · 1.67mm/px · 1 of 11 slices shown]
[im 1/11]
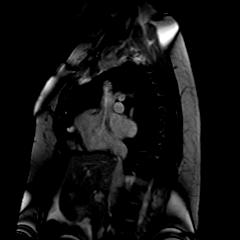

[Series 3: haste_16-sl_(person_name)_(person_name)_2bh · axial · 8.0mm · 1.33mm/px · 1 of 25 slices shown]
[im 1/25]
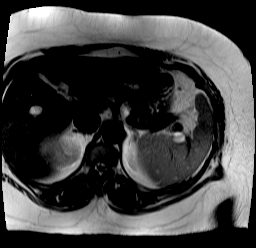

[Series 4: thorax_(person_name)_(person_name) · axial · 8.0mm · 1.67mm/px · 1 of 40 slices shown]
[im 1/40]
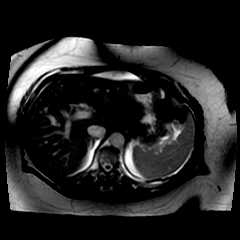

[Series 6: definelongaxis_3ch_4ch_2ch · axial · 8.0mm · 1.95mm/px · 1 of 3 slices shown]
[im 1/3]
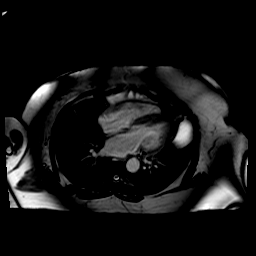

[Series 7: bSSFP · oblique · 6.0mm · 1.30mm/px · 1 of 25 slices shown (1 of 27)]
[im 1/25]
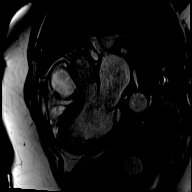

[Series 8: bSSFP · axial · 6.0mm · 1.30mm/px · 1 of 25 slices shown (2 of 27)]
[im 1/25]
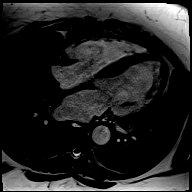

[Series 9: bSSFP · coronal · 6.0mm · 1.30mm/px · 1 of 25 slices shown (3 of 27)]
[im 1/25]
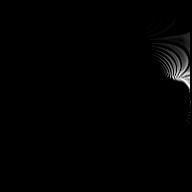

[Series 10: bSSFP · coronal · 6.0mm · 1.17mm/px · 1 of 18 slices shown (4 of 27)]
[im 1/18]
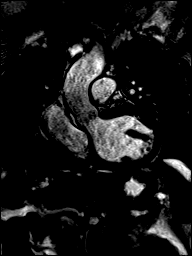

[Series 11: bSSFP · coronal · 6.0mm · 1.17mm/px · 1 of 18 slices shown (5 of 27)]
[im 1/18]
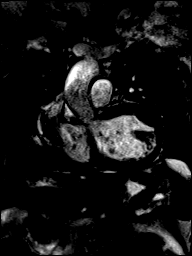

[Series 12: bSSFP · coronal · 6.0mm · 1.17mm/px · 1 of 18 slices shown (6 of 27)]
[im 1/18]
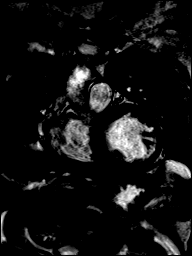

[Series 13: bSSFP · coronal · 6.0mm · 1.30mm/px · 1 of 25 slices shown (7 of 27)]
[im 1/25]
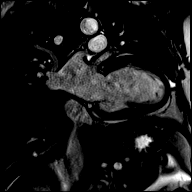

[Series 14: bSSFP · axial · 6.0mm · 1.56mm/px · 1 of 25 slices shown (8 of 27)]
[im 1/25]
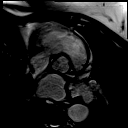

[Series 15: bSSFP · axial · 6.0mm · 1.56mm/px · 1 of 25 slices shown (9 of 27)]
[im 1/25]
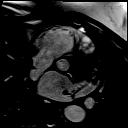

[Series 16: bSSFP · axial · 6.0mm · 1.56mm/px · 1 of 25 slices shown (10 of 27)]
[im 1/25]
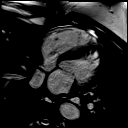

[Series 17: thru_flow_av · axial · 6.0mm · 1.88mm/px · 1 of 25 slices shown (1 of 2)]
[im 1/25]
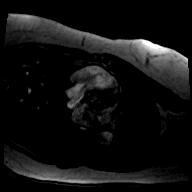

[Series 18: thru_flow_av_p · axial · 6.0mm · 1.88mm/px · 1 of 25 slices shown (1 of 2)]
[im 1/25]
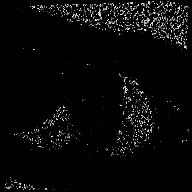

[Series 19: thru_flow_av · axial · 6.0mm · 1.88mm/px · 1 of 25 slices shown (2 of 2)]
[im 1/25]
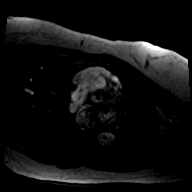

[Series 20: thru_flow_av_p · axial · 6.0mm · 1.88mm/px · 1 of 25 slices shown (2 of 2)]
[im 1/25]
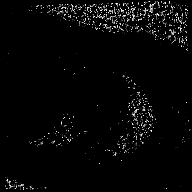

[Series 21: bSSFP · oblique · 6.0mm · 1.17mm/px · 1 of 19 slices shown (11 of 27)]
[im 1/19]
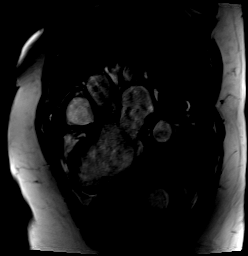

[Series 22: bSSFP · oblique · 6.0mm · 1.17mm/px · 1 of 19 slices shown (12 of 27)]
[im 1/19]
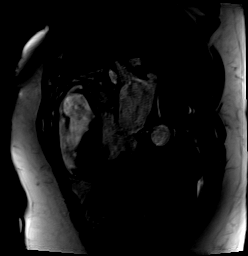

[Series 23: bSSFP · oblique · 6.0mm · 1.17mm/px · 1 of 19 slices shown (13 of 27)]
[im 1/19]
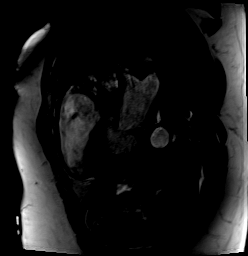

[Series 24: inplane_flow_3c · oblique · 6.0mm · 1.88mm/px · 1 of 25 slices shown]
[im 1/25]
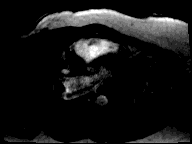

[Series 25: inplane_flow_3c_p · oblique · 6.0mm · 1.88mm/px · 1 of 25 slices shown]
[im 1/25]
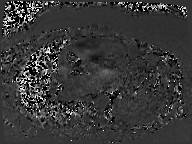

[Series 26: inplane_flow_3c orthag · coronal · 6.0mm · 1.88mm/px · 1 of 25 slices shown]
[im 1/25]
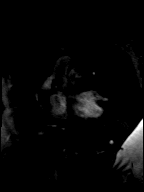

[Series 27: inplane_flow_3c orthag_p · coronal · 6.0mm · 1.88mm/px · 1 of 25 slices shown]
[im 1/25]
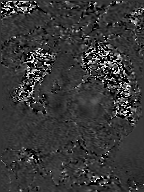

[Series 28: thru_flow_3c subav · axial · 6.0mm · 1.88mm/px · 1 of 25 slices shown]
[im 1/25]
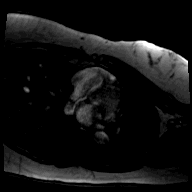

[Series 29: thru_flow_3c subav_p · axial · 6.0mm · 1.88mm/px · 1 of 25 slices shown]
[im 1/25]
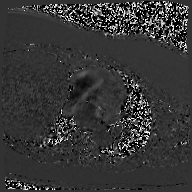

[Series 30: bSSFP · oblique · 7.0mm · 1.29mm/px · 1 of 25 slices shown (14 of 27)]
[im 1/25]
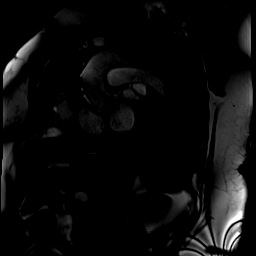

[Series 31: bSSFP · oblique · 7.0mm · 1.29mm/px · 1 of 25 slices shown (15 of 27)]
[im 1/25]
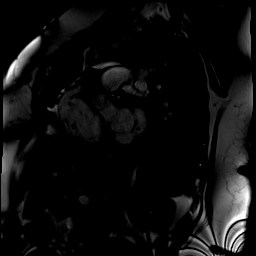

[Series 32: bSSFP · oblique · 7.0mm · 1.29mm/px · 1 of 25 slices shown (16 of 27)]
[im 1/25]
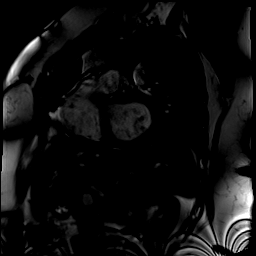

[Series 33: bSSFP · oblique · 7.0mm · 1.29mm/px · 1 of 25 slices shown (17 of 27)]
[im 1/25]
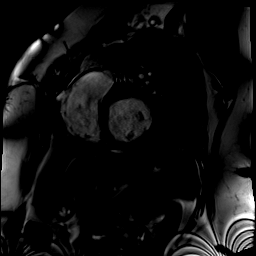

[Series 34: bSSFP · oblique · 7.0mm · 1.29mm/px · 1 of 25 slices shown (18 of 27)]
[im 1/25]
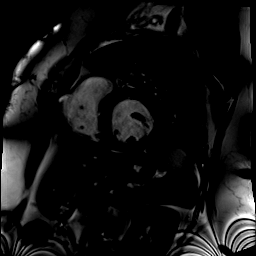

[Series 35: bSSFP · oblique · 7.0mm · 1.29mm/px · 1 of 25 slices shown (19 of 27)]
[im 1/25]
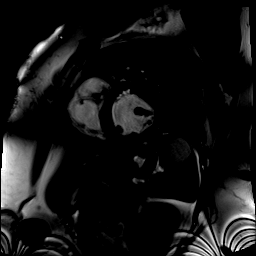

[Series 36: bSSFP · oblique · 7.0mm · 1.29mm/px · 1 of 25 slices shown (20 of 27)]
[im 1/25]
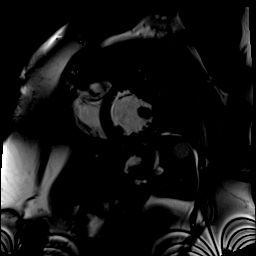

[Series 37: bSSFP · oblique · 7.0mm · 1.29mm/px · 1 of 25 slices shown (21 of 27)]
[im 1/25]
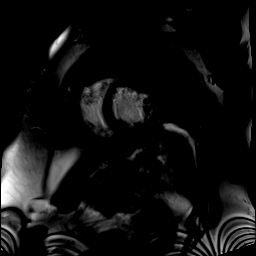

[Series 38: bSSFP · oblique · 7.0mm · 1.29mm/px · 1 of 25 slices shown (22 of 27)]
[im 1/25]
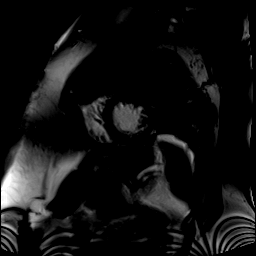

[Series 39: bSSFP · oblique · 7.0mm · 1.29mm/px · 1 of 25 slices shown (23 of 27)]
[im 1/25]
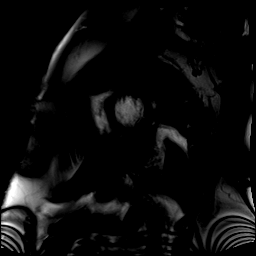

[Series 40: bSSFP · oblique · 7.0mm · 1.29mm/px · 1 of 25 slices shown (24 of 27)]
[im 1/25]
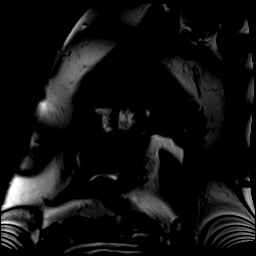

[Series 41: bSSFP · oblique · 7.0mm · 1.29mm/px · 1 of 25 slices shown (25 of 27)]
[im 1/25]
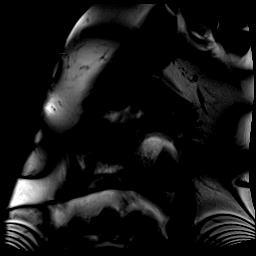

[Series 44: bSSFP · coronal · 6.0mm · 1.33mm/px · 1 of 25 slices shown (26 of 27)]
[im 1/25]
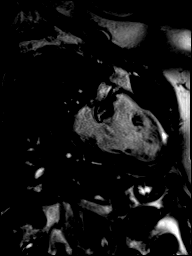

[Series 45: bSSFP · coronal · 6.0mm · 1.33mm/px · 1 of 25 slices shown (27 of 27)]
[im 1/25]
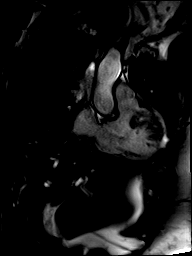

[Series 46: (id) pre_saxs · oblique · 7.0mm · 1.41mm/px · 1 of 24 slices shown]
[im 1/24]
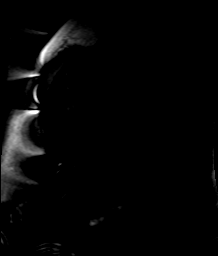

[Series 47: (id) pre_saxs_moco · oblique · 7.0mm · 1.41mm/px · 1 of 24 slices shown]
[im 1/24]
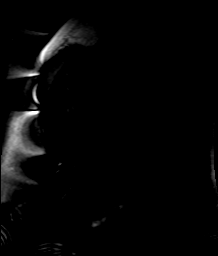

[Series 50: de_overview 4ch ss · axial · 7.0mm · 2.82mm/px · 1 of 15 slices shown (1 of 2)]
[im 1/15]
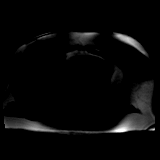

[Series 51: de_overview 4ch ss · axial · 7.0mm · 2.82mm/px · 1 of 15 slices shown (2 of 2)]
[im 1/15]
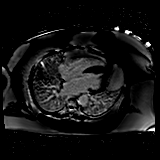

[Series 52: de_high-res lax new_3ch_4ch_2ch_mag · axial · 8.0mm · 1.76mm/px · 1 of 3 slices shown]
[im 1/3]
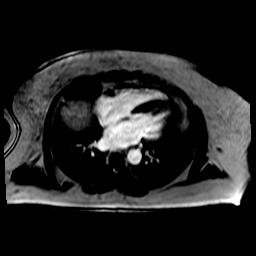

[46 of 48 positions shown; findings below may reference images not displayed]

FINDINGS: The pericardium is normal. There is no pericardial thickening. There is no pericardial effusion.
The left ventricle is normal in size. The left ventricular systolic function is normal. The left ventricular ejection fraction is 61.3%. There is focal hypertrophy noted in the basal anterior wall and mid inferoseptal wall, where the wall  thickness measures 1.5 and 1.4 cm, respectively. No regional wall motion abnormality, or focal areas of wall thinning, or aneurysm noted. No left ventricular thrombus.
Right ventricle is normal in size. The right ventricular systolic function is normal. No evidence of focal hypertrophy, wall thinning or dyskinesia.
The left atrium is moderately dilated. The right atrium is mildly dilated. There is no evidence of atrial septal defect. No thrombus is visible in the left atrial appendage.
Aortic valve is tricuspid with normal mobility. There is no evidence of stenosis or insufficiency.
The pulmonic valve appears morphologically normal. There is no evidence of stenosis or insufficiency.
Mitral valve leaflets appear thickened, but the valve opens well. There is trace mitral regurgitation. There is no mitral stenosis.
The tricuspid valve appears morphologically normal. There is no evidence of regurgitation or stenosis.
Delayed enhancement imaging is normal, with no evidence of delayed gadolinium enhancement to suggest scar or fibrosis.
Extracardiac vascular structures are normal with a normal caliber thoracic aorta and normal venous structures.
Measurements:
Aortic root 2.4 cm
Septal wall thickness 1.0 cm. Inferolateral wall thickness 0.9 cm
Maximum wall thickness 1.5 cm in the basal anterior wall
Left atrial area 36.3 cm2 (2 chamber); 31.2 cm2 (4 chamber)
Right atrial area 24.3 cm2 (4 chamber)
LA diameter 4.2 cm
LV EDD 4.6 cm
LV ESD 4.1 cm
LV EDV 166 ml (normal, 148 +/- 21)
Indexed LV EDV is 72.1 mL/m2
LV ESV 64 mL (normal, 48 +/- 11)
LV SV 102 ml (normal, 100 +/- 14)
LV EF 61.3% (normal, 63.7 +/-10.4)
Indexed LV Myocardial Mass is 77.3 g/m2
RV EDV 204 ml (normal, 153 +/- 25)
Indexed RV EDV is 88.4  mL/m2
RV ESV 104 ml (normal, 48 +/- 15)
RV SV 99 ml (normal, 105 +/- 17)
RV EF 48.8% (normal, 61.9 +/- 12.6)
IMPRESSION: 1. Normal-sized left ventricle with normal systolic function. LVEF 61.3%
2. Normal right ventricular size and systolic function. RVEF 48.8%.
3. Biatrial enlargement.
4. Asymmetric left ventricular hypertrophy, as described above, but no features to suggest hypertrophic or an infiltrative cardiomyopathy.
Thanks for the referral.
[place], [place]
Is the patient pregnant?
Unknown

## 2021-07-23 IMAGING — MR MRI CERVICAL SPINE WITH AND WITHOUT CONTRAST
9 of 12 series · 27 of 48 positions shown · non-contrast
Comparison: none

CANCER STAGING
FINAL REPORT:
MRI of the cervical spine with and without contrast
HISTORY: Malignant neoplasm of upper-outer quadrant of right female breast. Left arm paresthesias.
TECHNIQUE: Multiplanar and multisequence images were obtained through the cervical spine before and after administration of IV contrast. Informed consent was obtained prior to administration of IV contrast.

[Series 1001: survey · coronal · 1.7mm · 1.67mm/px · 2 of 96 slices shown]
[im 1/96]
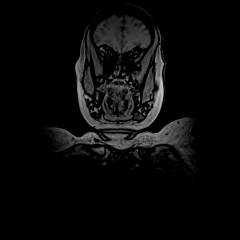
[im 14/96]
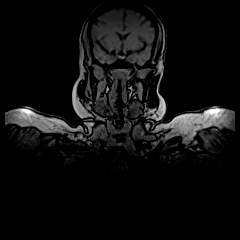

[Series 5001: T2 · sagittal · 3.0mm · 0.38mm/px · 2 of 15 slices shown (1 of 2)]
[im 1/15]
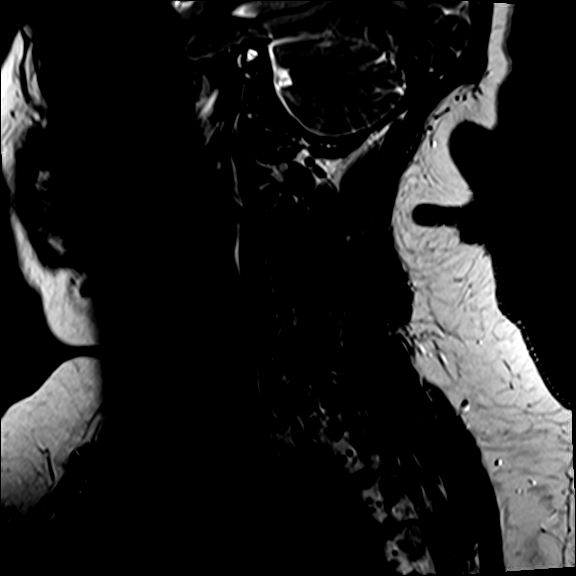
[im 15/15]
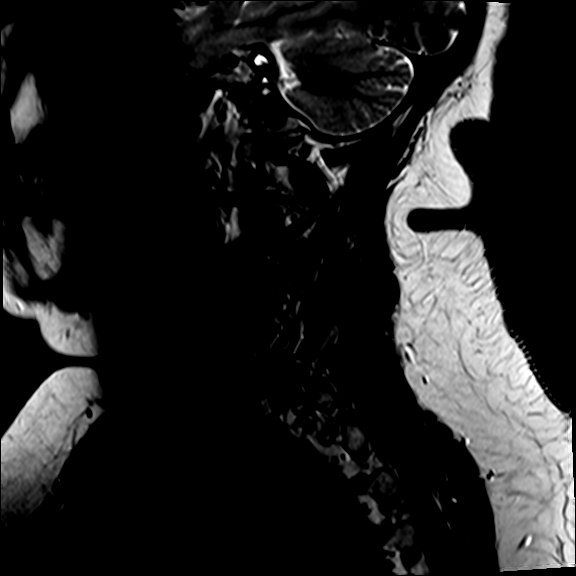

[Series 6001: T1 · sagittal · 3.0mm · 0.69mm/px · 2 of 15 slices shown (1 of 2)]
[im 1/15]
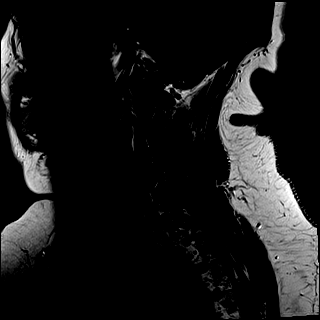
[im 15/15]
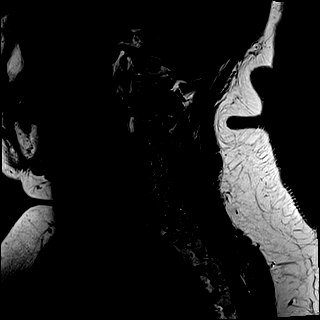

[Series 7001: STIR · sagittal · 3.0mm · 0.86mm/px · 2 of 15 slices shown]
[im 1/15]
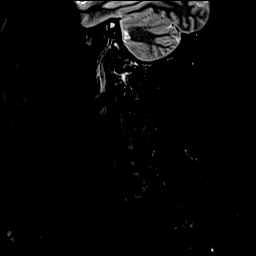
[im 15/15]
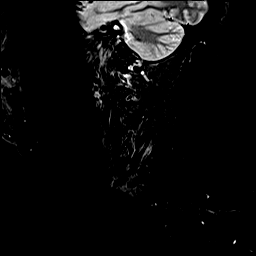

[Series 9001: T2 · axial · 2.0mm · 0.70mm/px · z∈[-72,+19]mm · 7 of 48 slices shown (2 of 2)]
[im 1/48]
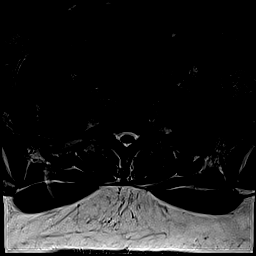
[im 8/48]
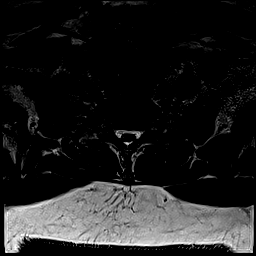
[im 16/48]
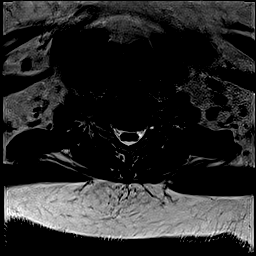
[im 24/48]
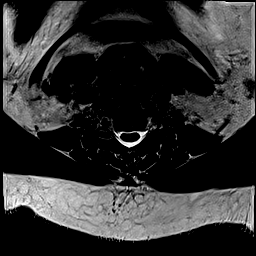
[im 32/48]
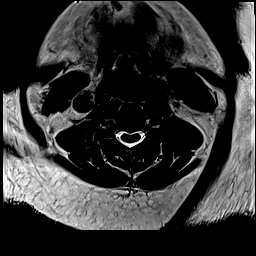
[im 40/48]
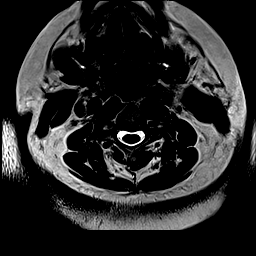
[im 48/48]
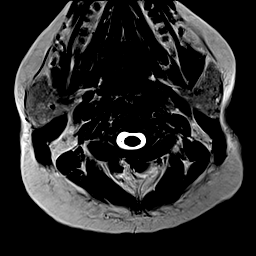

[T1 · axial · 3.0mm · 0.35mm/px · z∈[-70,+16]mm · 4 of 28 slices shown (2 of 2)]
[im 1/28]
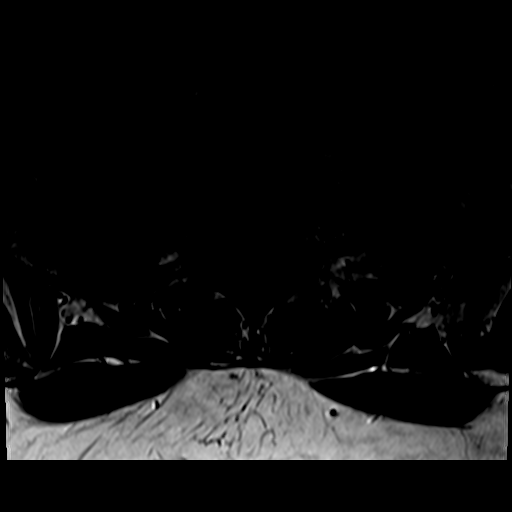
[im 10/28]
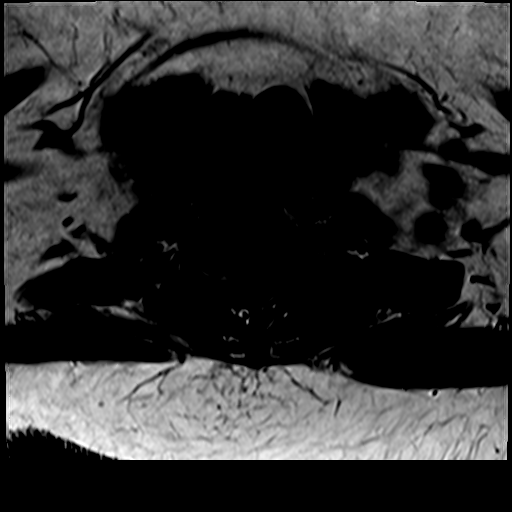
[im 19/28]
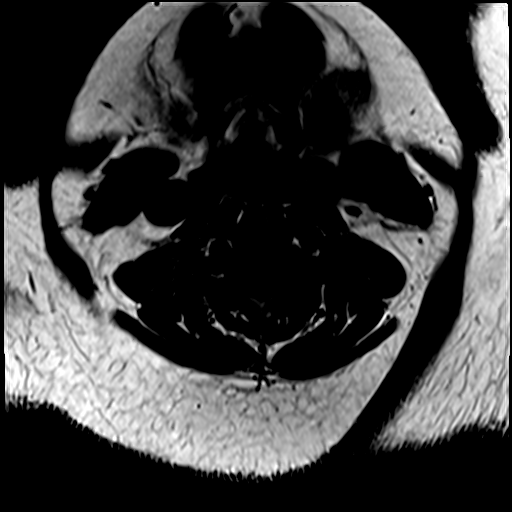
[im 28/28]
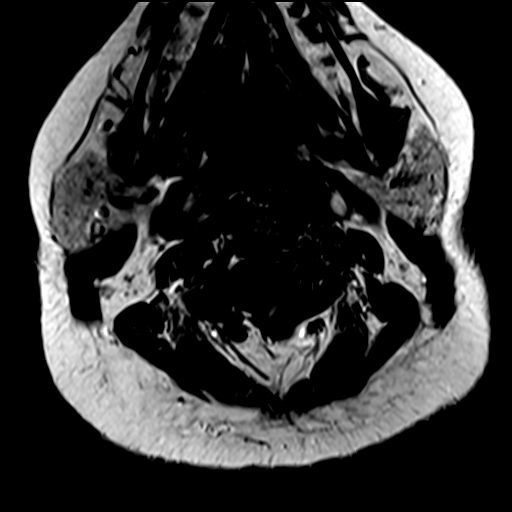

[T1 post-contrast · sagittal · 3.0mm · 0.43mm/px · 2 of 15 slices shown (1 of 2)]
[im 1/15]
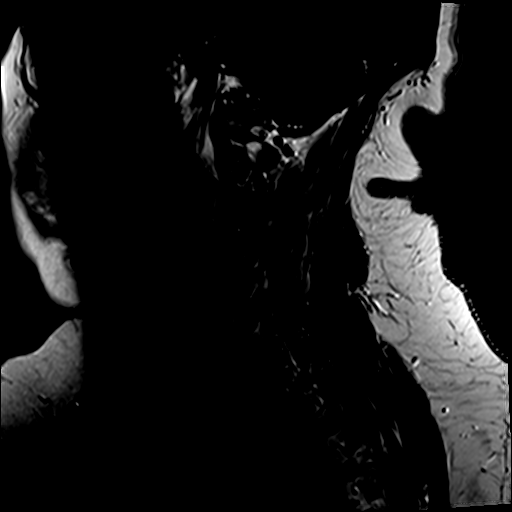
[im 15/15]
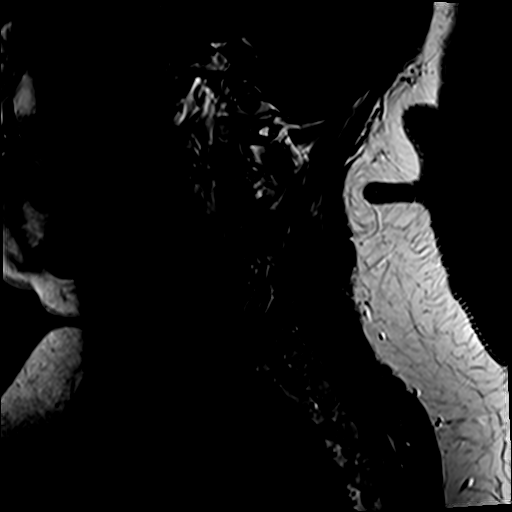

[T1 fat-sat post-contrast · sagittal · 3.0mm · 0.43mm/px · 2 of 15 slices shown]
[im 1/15]
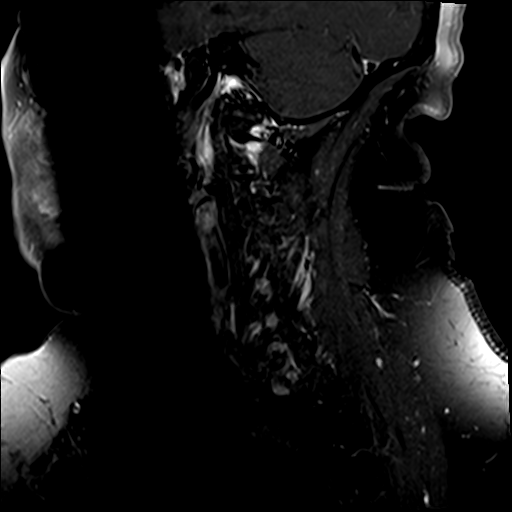
[im 15/15]
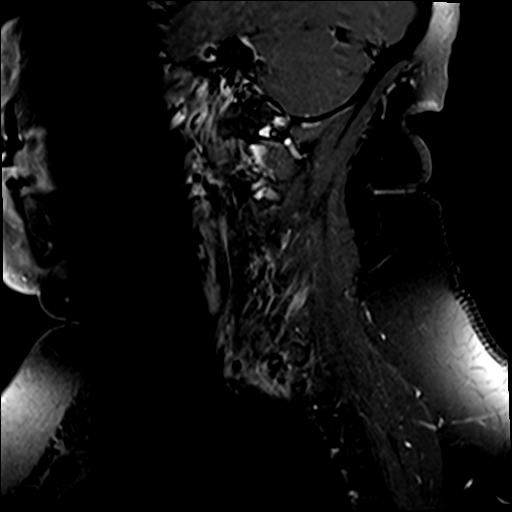

[T1 post-contrast · axial · 3.0mm · 0.35mm/px · z∈[-70,+16]mm · 4 of 28 slices shown (2 of 2)]
[im 1/28]
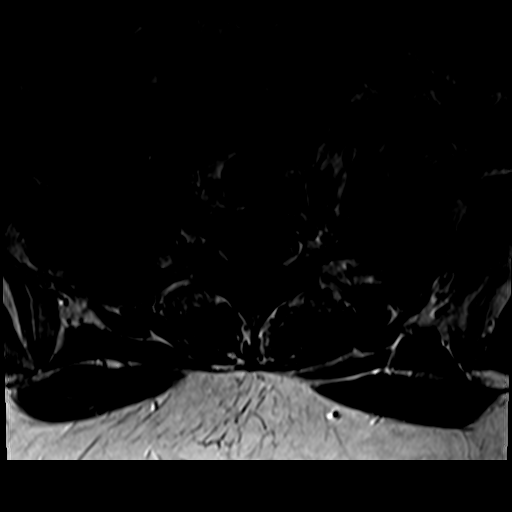
[im 10/28]
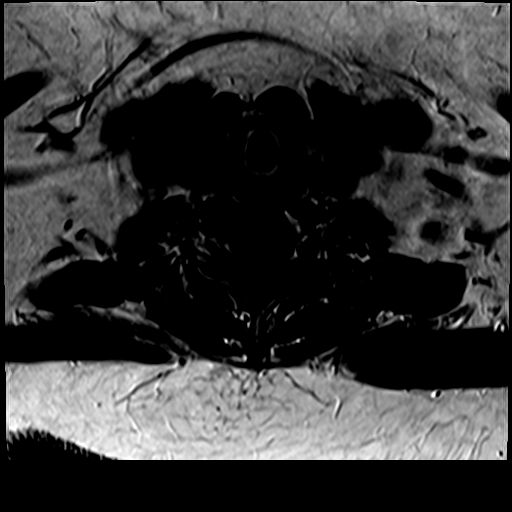
[im 19/28]
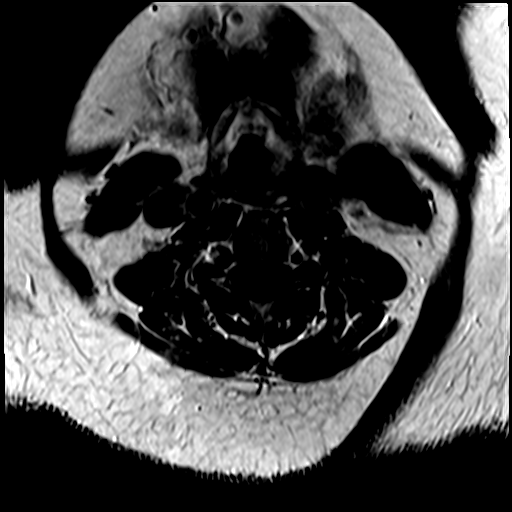
[im 28/28]
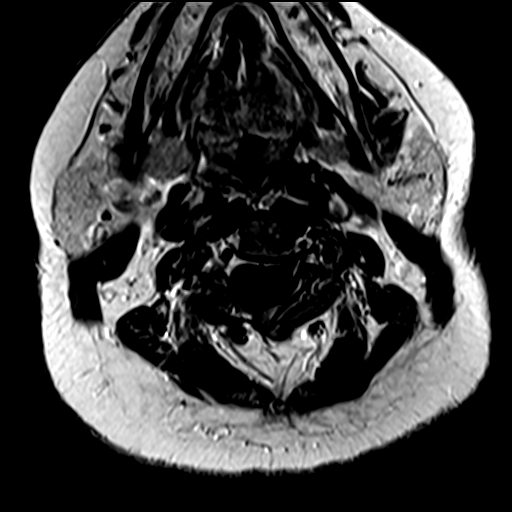

[27 of 48 positions shown; findings below may reference images not displayed]

FINDINGS: Cervical spine and cervical thoracic junction is in anatomic alignment. The cervical vertebral body heights are preserved. No marrow edema within the cervical vertebra. No abnormal T2 hyperintense signal in the cervical spinal cord. Image portions of the posterior fossa contents are unremarkable.
There appears to be an enlarged left supraclavicular lymph node measuring 1.3 x 2.0 cm on image 5, series 27772. The node is not seen on a CT chest from 07/15/2020.
Ad C2-3 there is no significant spinal canal or neural foraminal stenosis.
At C3-4 there is no significant spinal canal or neural foraminal stenosis.
At C4-5 there is a disc osteophyte complex partially effacing the ventral thecal sac without significant spinal canal or neural foraminal stenosis.
At C5-6 there is a small disc osteophyte complex mildly effacing the ventral thecal sac without significant spinal canal or left neural foraminal stenosis. There is mild right neural foraminal stenosis.
At C6-7 there is a circumferential disc osteophyte complex partially effacing the ventral thecal sac causing mild spinal canal stenosis. No significant neural foraminal stenosis.
At C7-T1 there is a small posterior disc osteophyte complex without significant spinal canal or neural foraminal stenosis.
Postcontrast enhanced images demonstrates no abnormal enhancement of the cervical vertebra, intervertebral discs, or of the spinal cord.
IMPRESSION: 1. Mild spinal canal stenosis at C6-7.
2. No findings of osseous metastatic disease within the cervical spine.
3. Soft tissue nodule in the left supraclavicular region suggestive of a mildly enlarged lymph node. PET/CT may be helpful in further evaluation.
Is the patient pregnant?
No

## 2021-07-29 IMAGING — CT PET^TMC_SKULL_THIGH (Adult)
1 of 7 series · 3 of 16 positions shown, 4 images · non-contrast
Comparison: Diagnostic mammogram from 12/22/2020, CT chest from 07/15/2020

FINAL REPORT:
INDICATION: Malignant neoplasm of upper-outer quadrant of right female breast
Estrogen receptor positive status (ER+)
Exam: PET/CT utilizing 10.97 mCi of F-18 FDG administered via the left antecubital fossa IV. Blood glucose at time of testing was 107. Axial CT images from the mid skull to the upper thighs  was obtained for attenuation correction and anatomic localization. These images and 3-D reconstructions were performed on an independent workstation.

[Series 4: ct wb · axial · 0.98mm/px · z∈[-846,-408]mm · 3 of 439 slices shown, 4 images]
[im 110/439  soft-tissue]
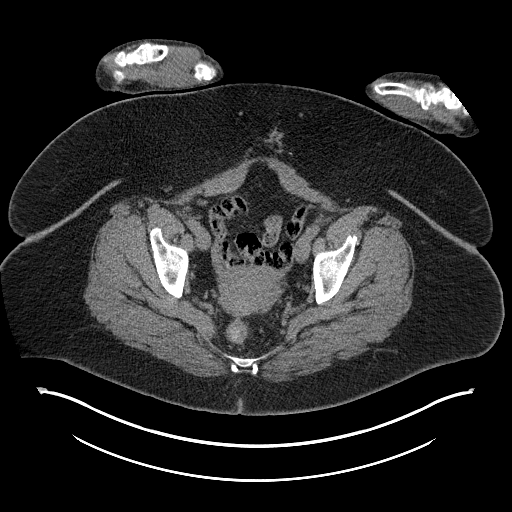
[im 110/439  bone]
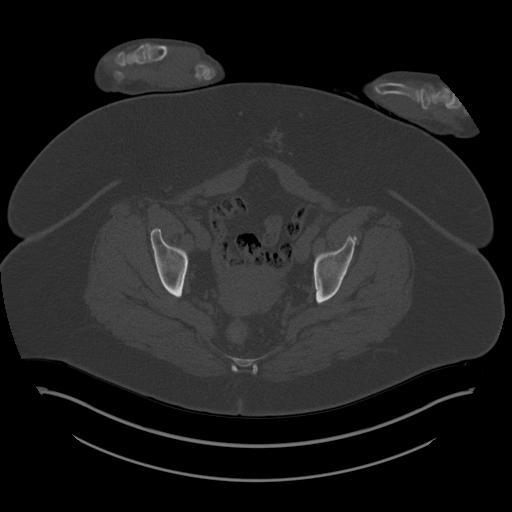
[im 220/439  soft-tissue]
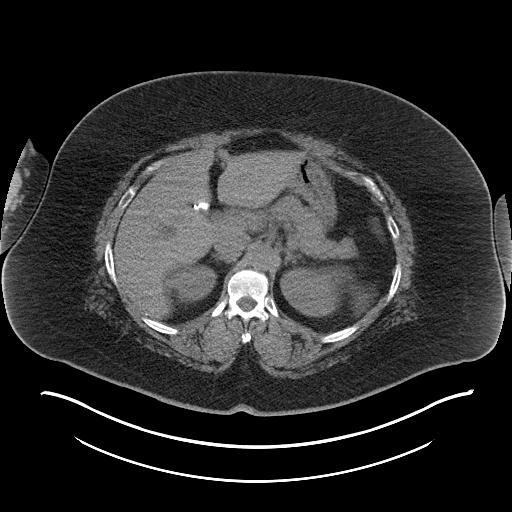
[im 329/439  soft-tissue]
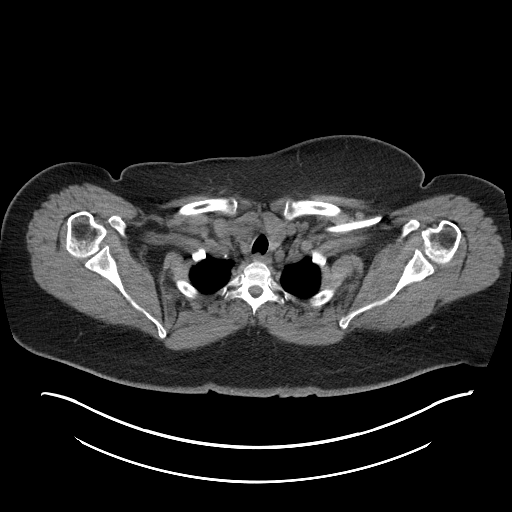

[3 of 16 positions shown; findings below may reference images not displayed]

FINDINGS: PET/CT:
Post surgical changes of right lumpectomy. There is hypermetabolic activity along the internal mammary chains with hypermetabolic nodes. Left internal mammary node chain conglomerate measures 4.3 x 1.4 cm with max SUV of 7.8. Right internal mammary node activity measures up to 7.7 maximum SUV.
Left axillary and supraclavicular adenopathy are present with left supraclavicular node measuring 1.8 cm in diameter with max SUV of 8.2.
Left-sided pectoral adenopathy measures up to 2.4 x 1.3 cm with max SUV of 9.8.
The background hepatic uptake measures up to 5.[address]
CT head and neck:
Visualized intracranial structures: Unremarkable.
Orbits, paranasal sinuses, and skull base: Unremarkable.
Nasopharynx: Unremarkable.
Suprahyoid neck: Unremarkable oropharynx, oral cavity, parapharyngeal space, and retropharyngeal space.
Infrahyoid neck: Normal larynx, hypopharynx, and supraglottis.
Thyroid: Unremarkable.
Thoracic inlet: Lung apices are unremarkable.
Vascular structures: Unremarkable.
CT thorax:
Lung parenchyma: Unremarkable.
Pleural effusion: None.
Central airways: Patent
Heart: Unremarkable. No pericardial effusion. Left chest wall port reaching the mid right atrium slightly deviated towards the ventricle. This is unchanged.
CT abdomen/pelvis:
Gallbladder and biliary tree: Unremarkable.
Liver: Unremarkable.
Adrenal glands: Unremarkable.
Kidneys and ureters: Unremarkable.
Pancreas: Unremarkable.
Spleen: Unremarkable.
Vessels: The abdominal aorta is nonaneurysmal.
Peritoneum: No pneumoperitoneum or free intraperitoneal fluid collections.
Bowel: Left lower quadrant colostomy.
Bladder: Unremarkable.
Reproductive organs: Uterus unremarkable. No adnexal mass.
Lymph nodes
Retroperitoneal: Unremarkable.
Mesenteric: Unremarkable.
Pelvic: Unremarkable.
Abdominal wall: Unremarkable.
Bones: Unremarkable.
IMPRESSION: 
IMPRESSION: 1. Abnormal hypermetabolic adenopathy involving the bilateral internal mammary chain nodes and left supraclavicular and subpectoral nodes. Given prior history of breast cancer this is suspicious for malignancy.
Is the patient pregnant?
No

## 2021-08-06 IMAGING — CT CT GUIDED NEEDLE BIOPSY LYMPH NODE SUPERFICIAL
1 of 2 series · 13 of 30 positions shown, 17 images · non-contrast
Comparison: PET/CT from 07/29/2021

Dr Mufachir Minarni Radiologist, Miscellanius Asfati RN, Sanioc Kaced RT(R)(CT) sedation time 6 minutes
FINAL REPORT:
Brief operative note:
Operator: Moolman, Klpigbb
Margareth: None
Pre and postop diagnosis: Malignant neoplasm of upper-outer quadrant of right female breast
Estrogen receptor positive status (ER+)
Procedure: CT GUIDED NEEDLE BIOPSY LYMPH NODE SUPERFICIAL
Anesthesia: Local using 1% lidocaine , moderate conscious sedation using graded doses of fentanyl and Versed
Specimen: Multiple core biopsies
Estimated blood loss: Minimal
Blood administered: None
Complications: None
Implants: None
Full operative report:
INDICATION: Malignant neoplasm of upper-outer quadrant of right female breast
Exam: CT guided biopsy of left supraclavicular adenopathy .
All CT scans at this facility use iterative reconstruction technique, dose modulation and/or weight based dosing when appropriate to reduce radiation dose to as low as reasonably achievable.

[Series 2: biopsy · axial · 0.67mm/px · z∈[-171,-61]mm · 13 of 26 slices shown, 17 images]
[im 2/26  mediastinal]
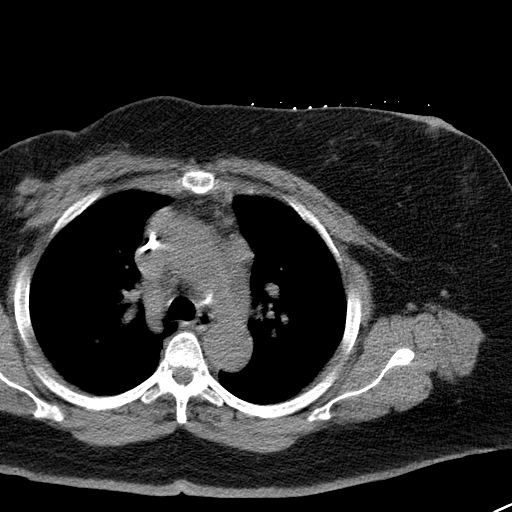
[im 2/26  lung]
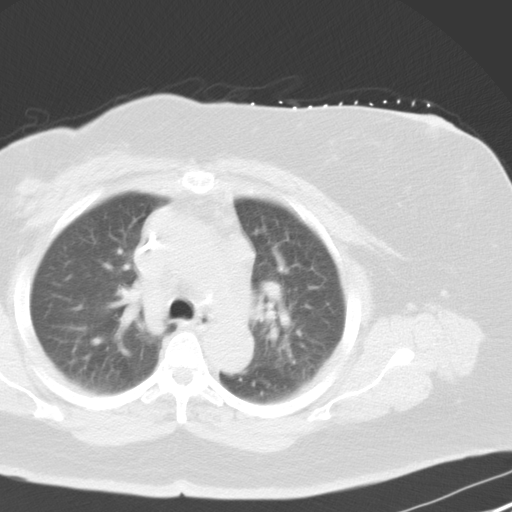
[im 4/26  lung]
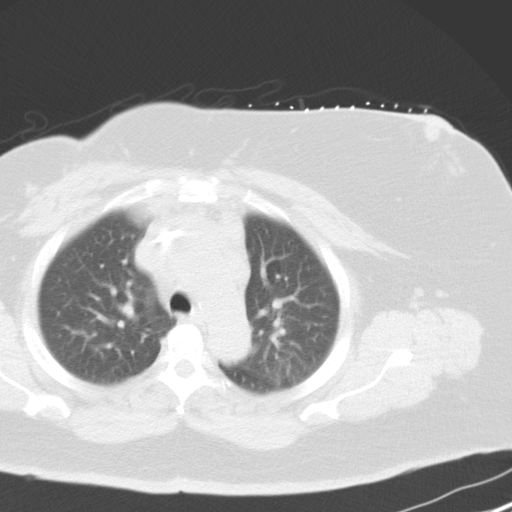
[im 6/26  lung]
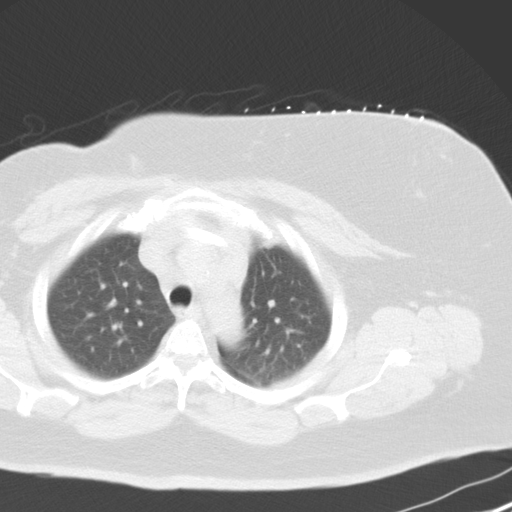
[im 8/26  lung]
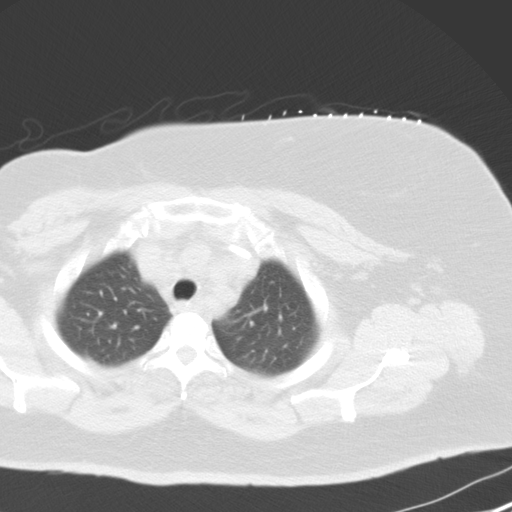
[im 10/26  mediastinal]
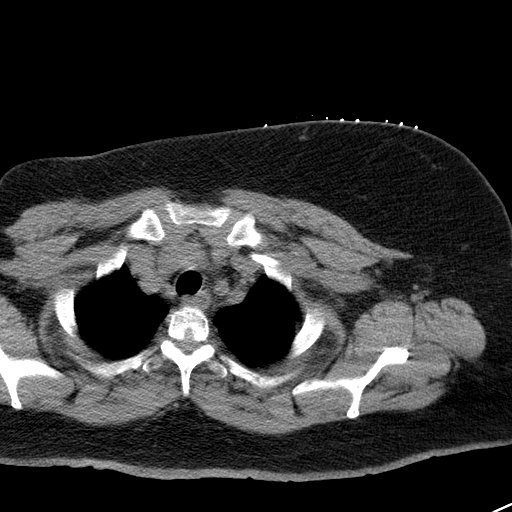
[im 10/26  lung]
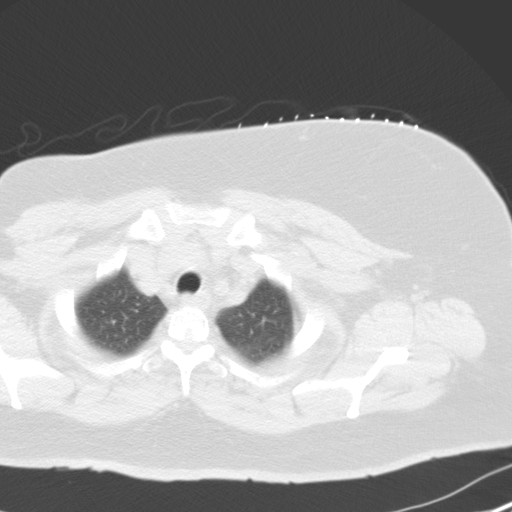
[im 12/26  lung]
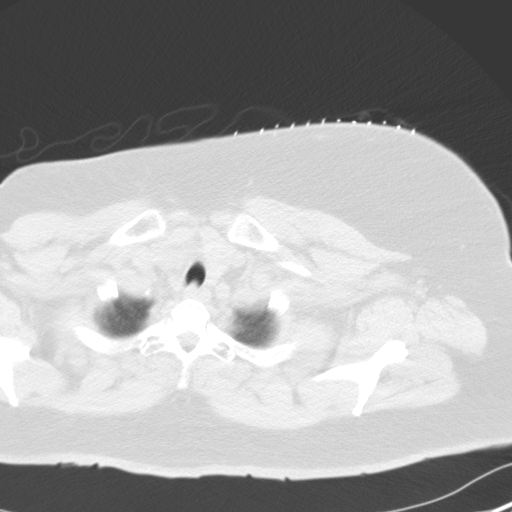
[im 13/26  lung]
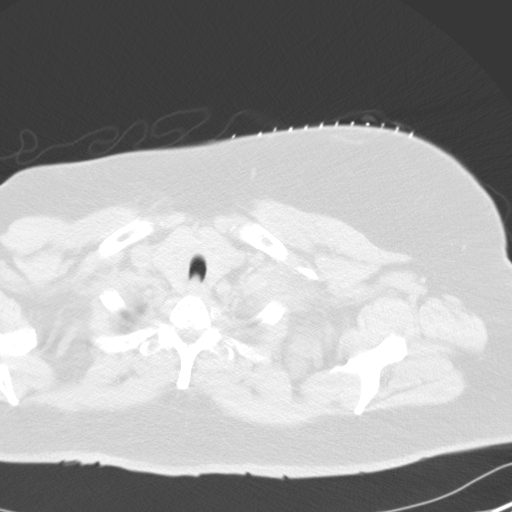
[im 14/26  lung]
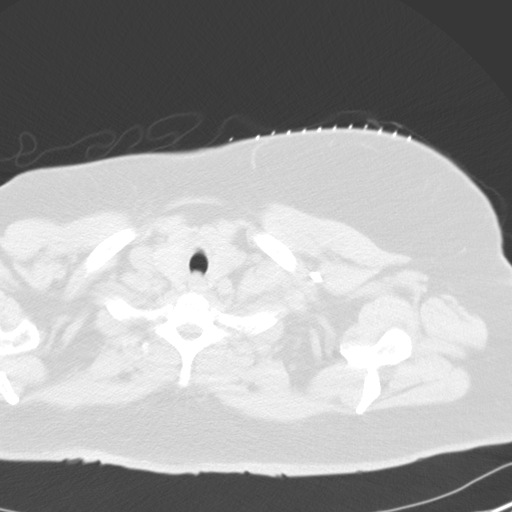
[im 16/26  mediastinal]
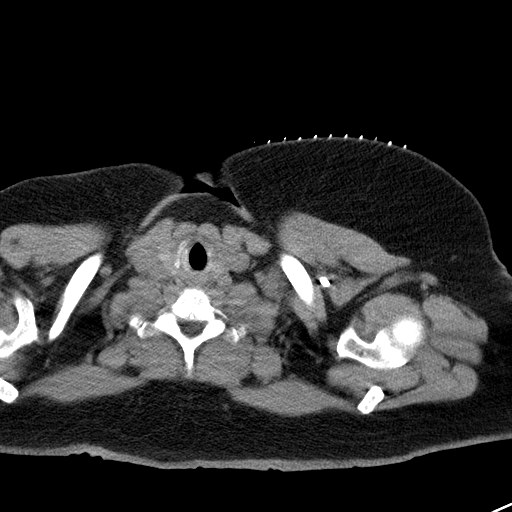
[im 16/26  lung]
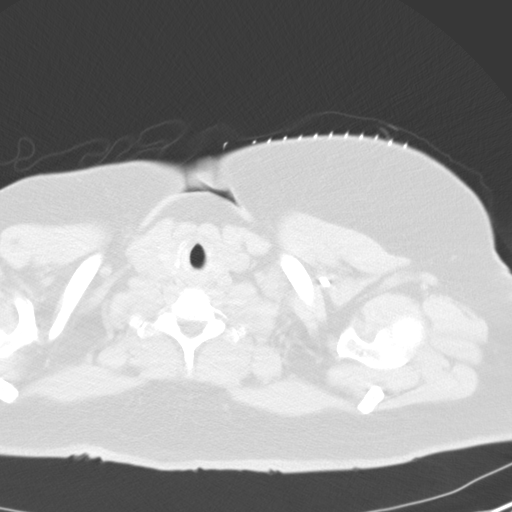
[im 18/26  lung]
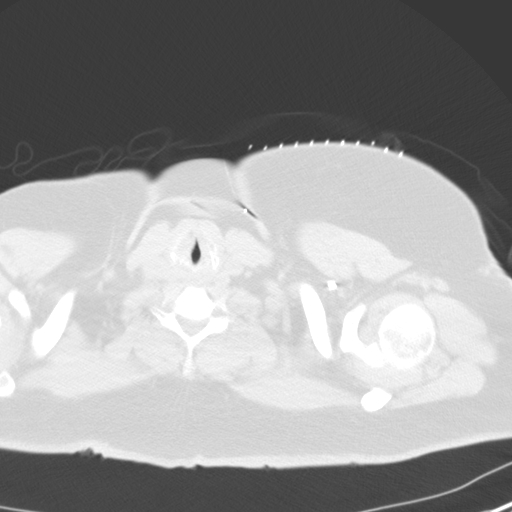
[im 20/26  lung]
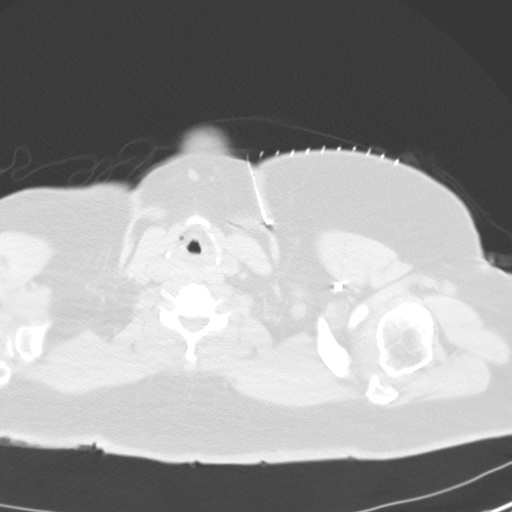
[im 22/26  lung]
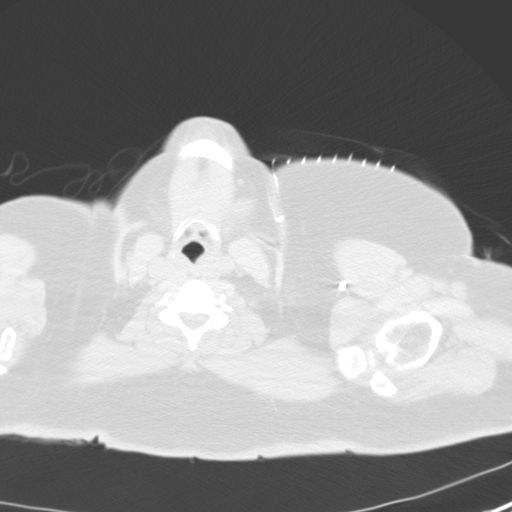
[im 24/26  mediastinal]
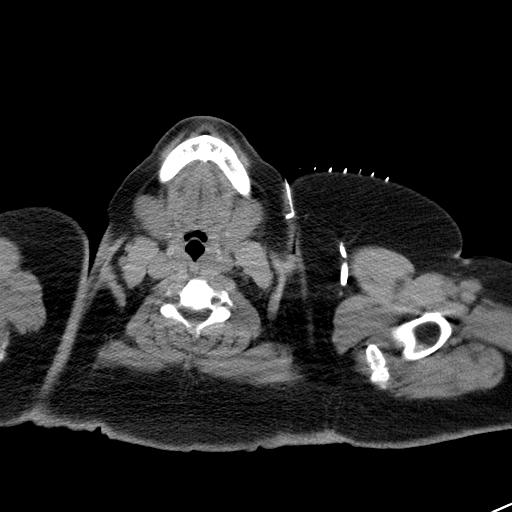
[im 24/26  lung]
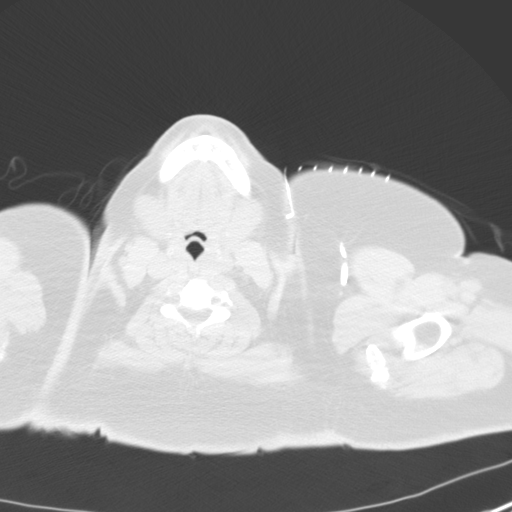

[13 of 30 positions shown; findings below may reference images not displayed]

FINDINGS: The risks and benefits of the procedure were discussed with the patient. Informed consent was obtained. The patient was placed on the CT table in prone position. Limited CT of the area of interest shows left supraclavicular adenopathy. An appropriate access site was selected. The area was sterilely prepared and draped in the usual fashion. Moderate conscious sedation was obtained using graded doses of fentanyl and Versed for a total sedation time of 6 minutes. A trained intraprocedural observer was present at all times. Direct face-to-face monitoring was provided during sedation. Local anesthesia was obtained using 1% lidocaine. A 17 -gauge coaxial needle was advanced under CT guidance to the target of interest. Multiple 18 -gauge core biopsies were obtained. The needle was then removed and an occlusive bandage was placed over the access site. Postprocedural imaging showed no immediate postprocedural complications.
IMPRESSION: 
IMPRESSION: Technically successful CT-guided biopsy of left supraclavicular adenopathy
Final pathology is pending.
Is the patient pregnant?
No

## 2021-11-11 IMAGING — US US UPPER EXTREMITY VEINS RIGHT
1 series · 14 of 25 positions shown · non-contrast
Comparison: None available.

FINAL REPORT:
INDICATION: Cervicalgia
EXAM: Right  Upper extremity venous Doppler ultrasound

[Series 1: us upper extremity veins right · 14 of 46 slices shown]
[im 1/46]
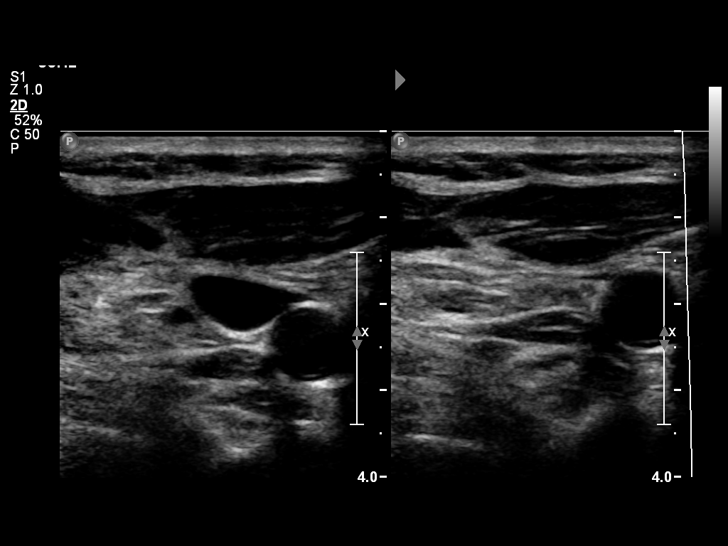
[im 4/46]
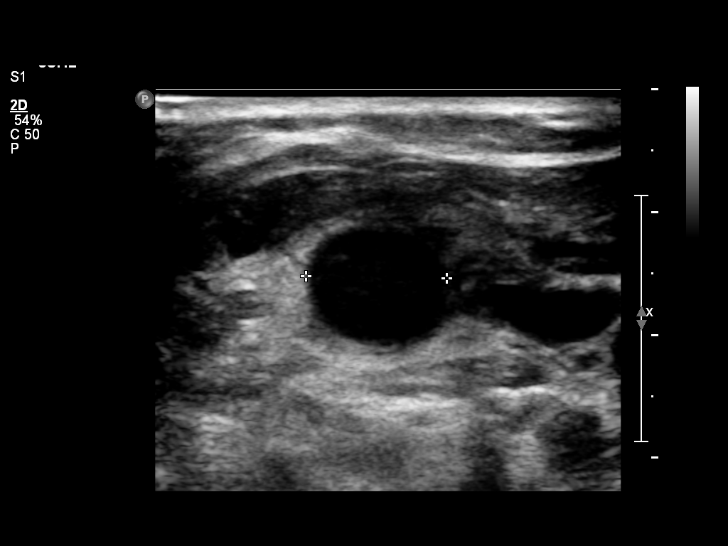
[im 8/46]
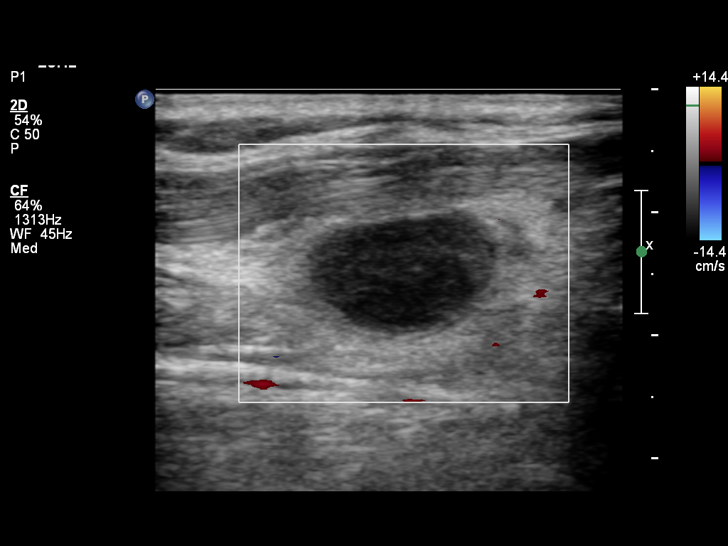
[im 12/46]
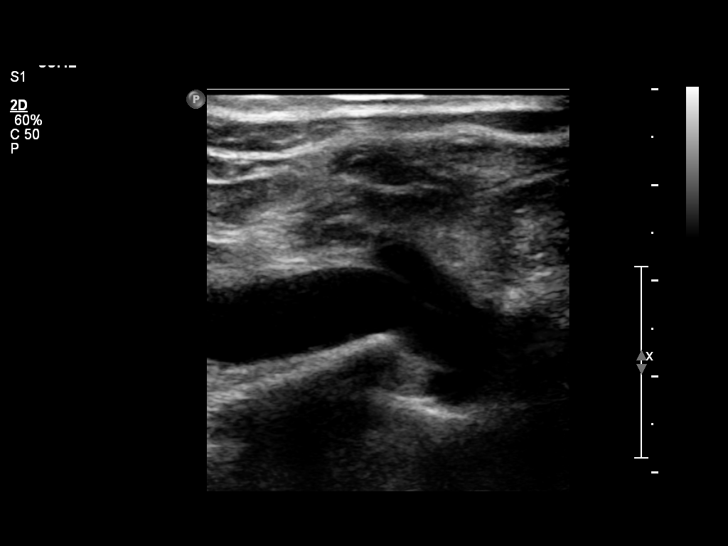
[im 16/46]
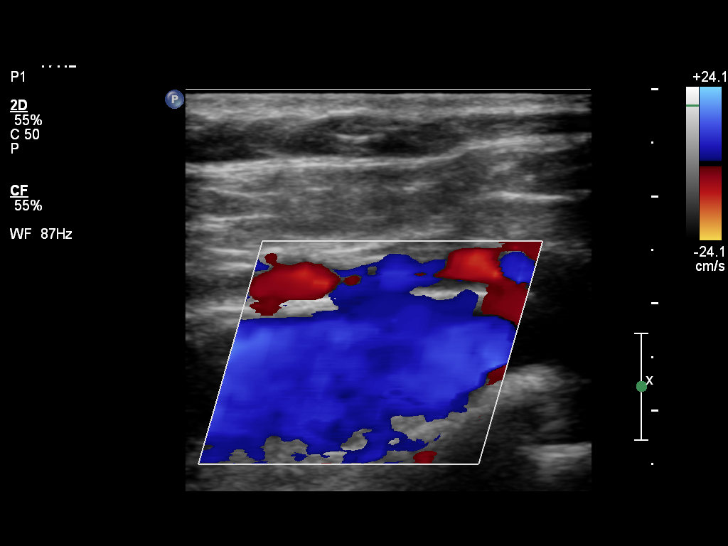
[im 17/46]
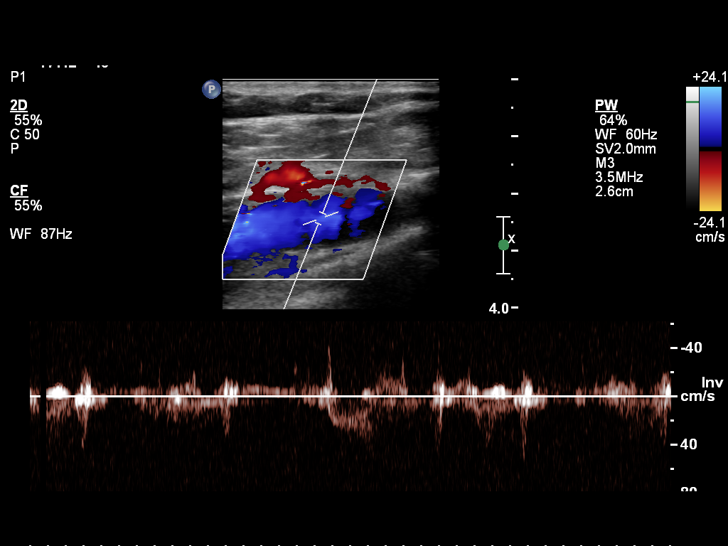
[im 21/46]
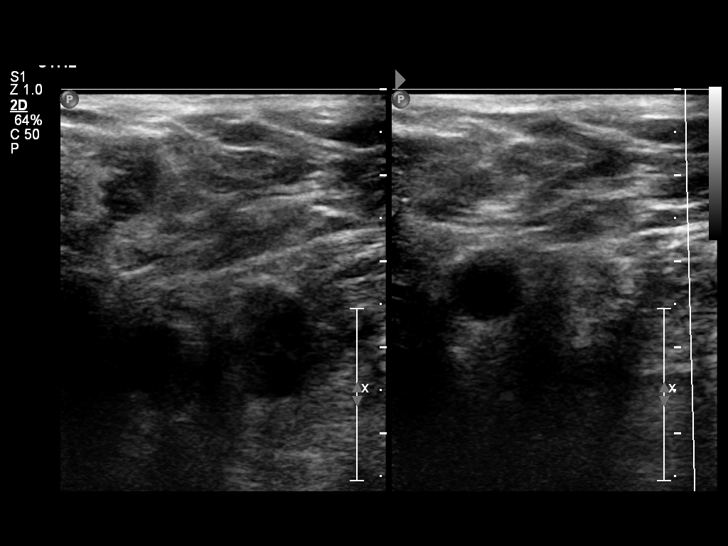
[im 25/46]
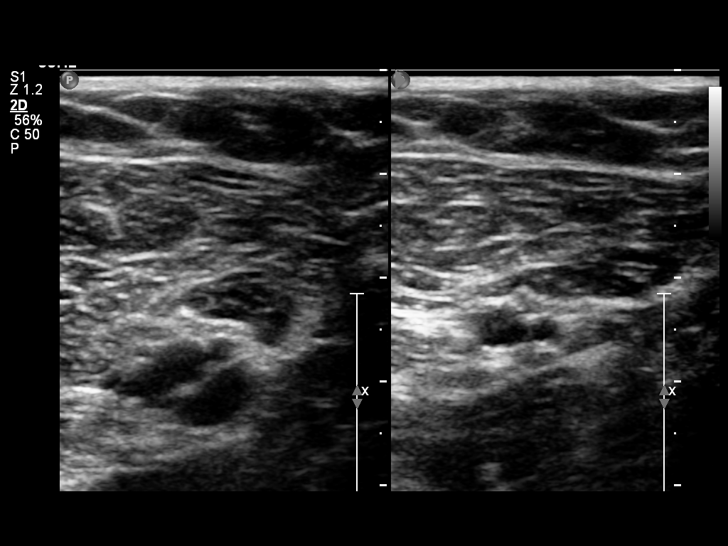
[im 29/46]
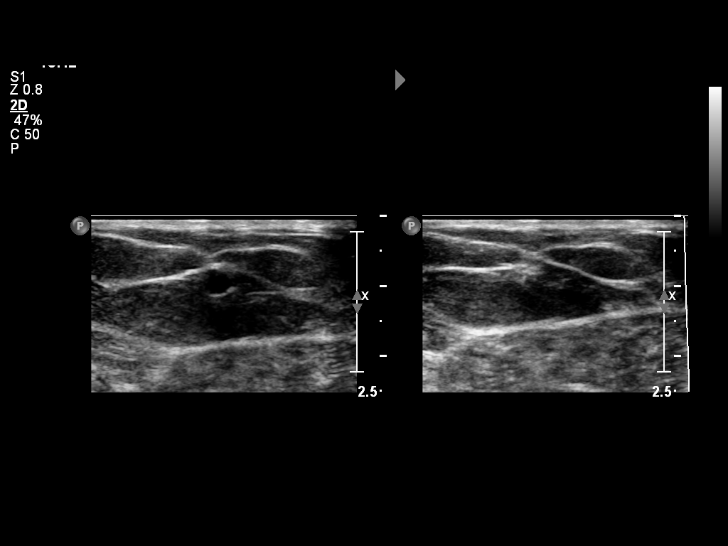
[im 31/46]
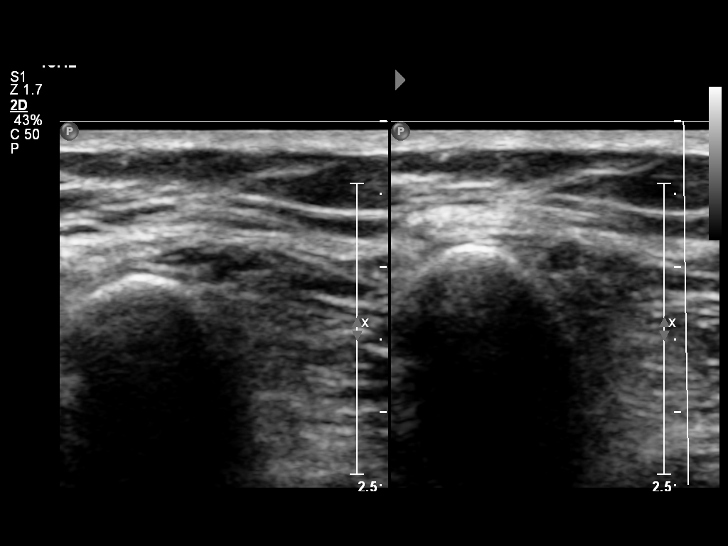
[im 34/46]
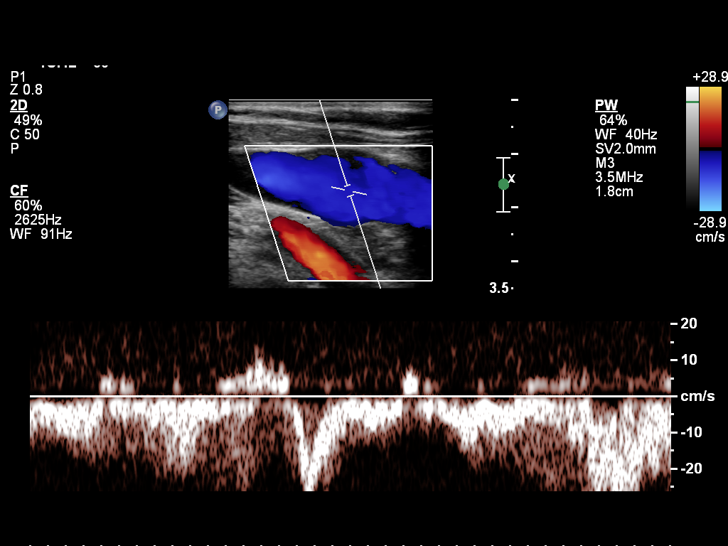
[im 38/46]
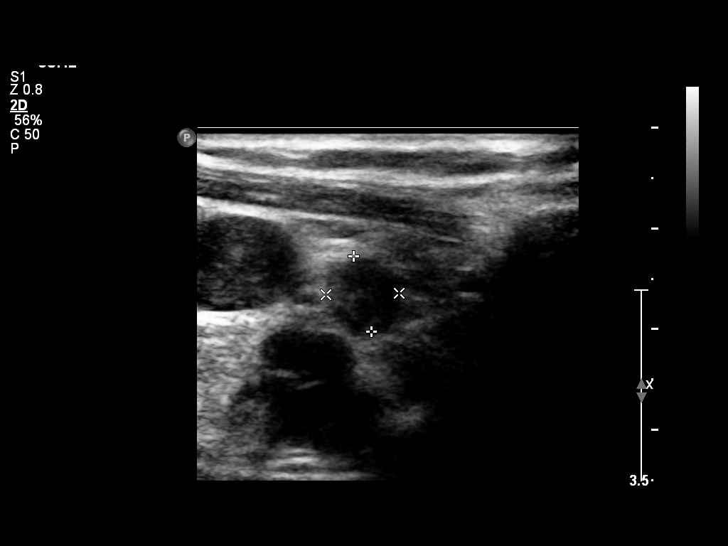
[im 42/46]
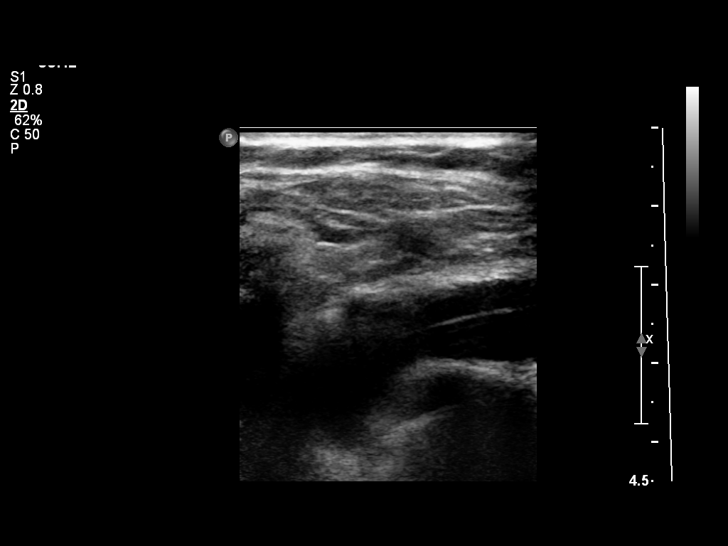
[im 46/46]
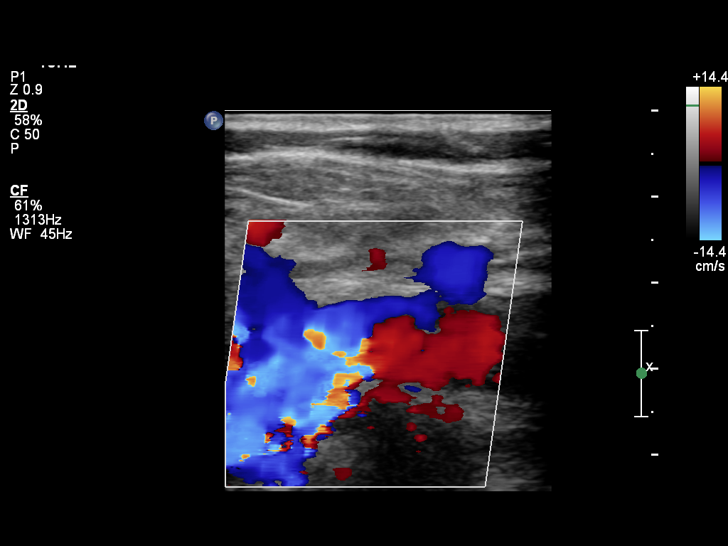

[14 of 25 positions shown; findings below may reference images not displayed]

FINDINGS: Right upper extremity venous ultrasound was performed from the level of the internal jugular vein through the forearm. The interrogated veins are patent and compressible with normal waveforms upon augmentation. There is normal color flow. Bilateral neck lymph nodes are present. On the right, these measure up to 19 mm and are considered enlarged. On the left they are are up to 15 mm.
IMPRESSION: 1. No upper extremity venous thrombus identified.
2. Lymphadenopathy in the neck bilaterally. Given history of breast cancer these could be neoplastic

## 2021-11-23 IMAGING — US US PELVIS TRANSVAGINAL
1 series · 14 of 22 positions shown · non-contrast
Comparison: none

This is a summary report. The complete report is available in the patient's medical record. If you cannot access the medical record, please contact the sending organization for a detailed fax or copy.
INDICATION: History of fibroids.
Ultrasound demonstrated uterus that measures 4.8 x 2.9 x 5.0cm.  Endometrium measures 0.2cm. Intramural fibroids noted measuring 1.0 x 0.8 x 0.9cm and 0.9 x 0.8 x 1.0cm.  Ovaries not seen due to overlying bowel gas. No free fluid seen within the pelvis.

[Series 1: us pelvis transvaginal · 14 of 22 slices shown]
[im 1/22]
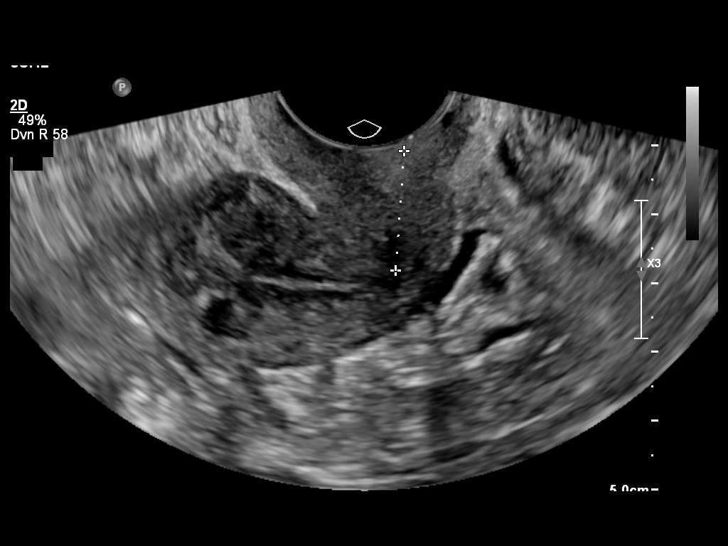
[im 3/22]
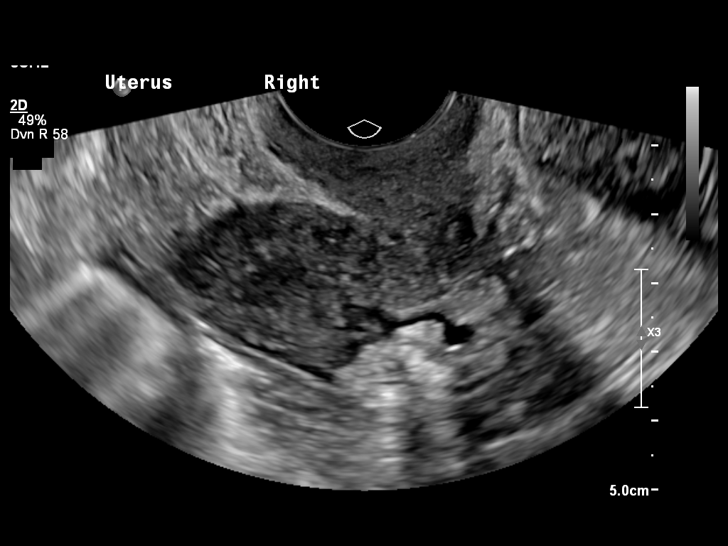
[im 4/22]
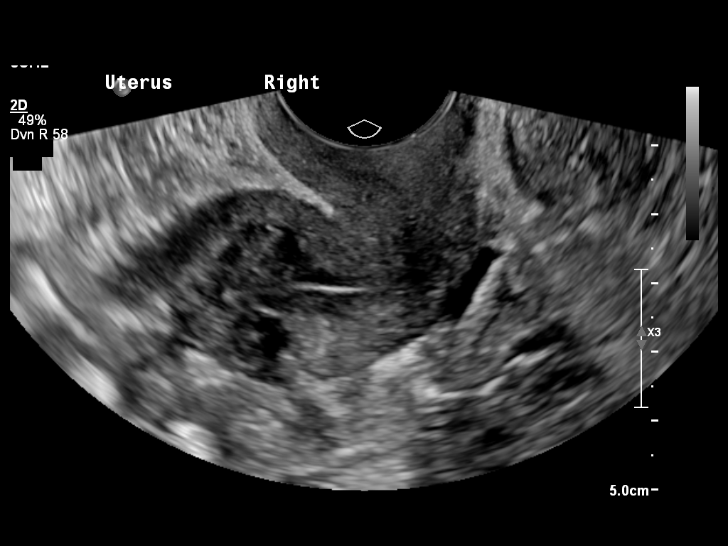
[im 6/22]
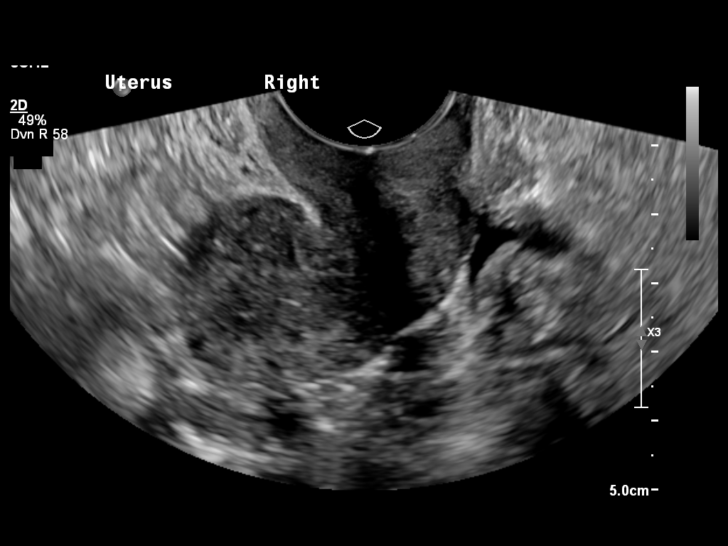
[im 8/22]
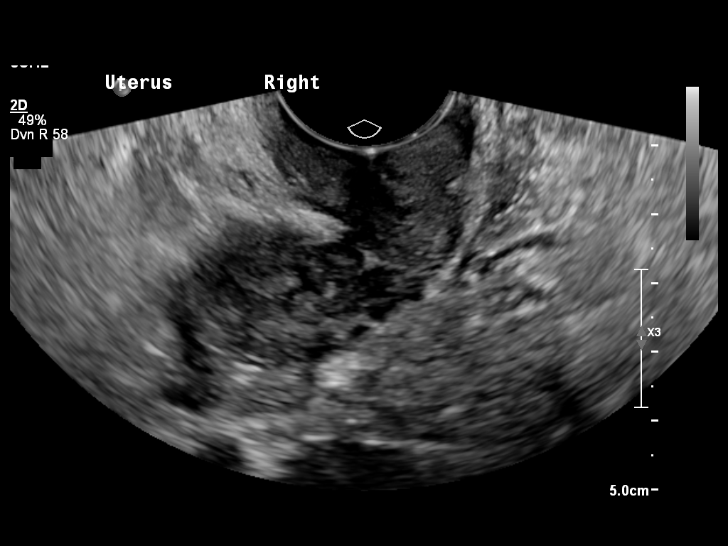
[im 9/22]
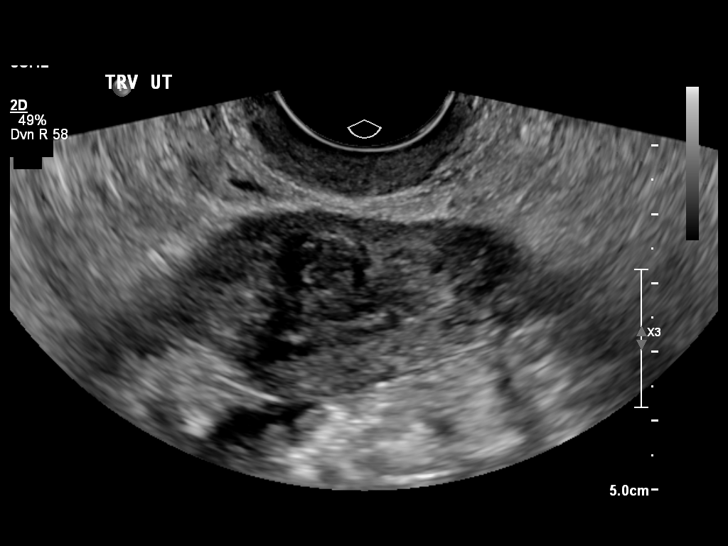
[im 11/22]
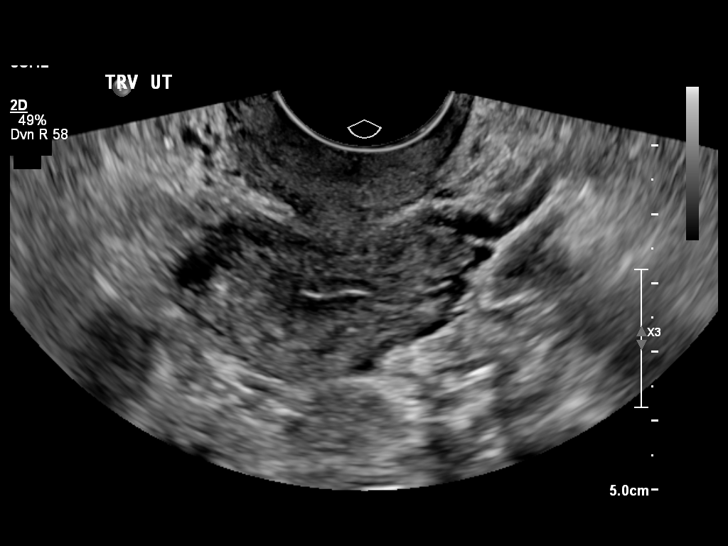
[im 12/22]
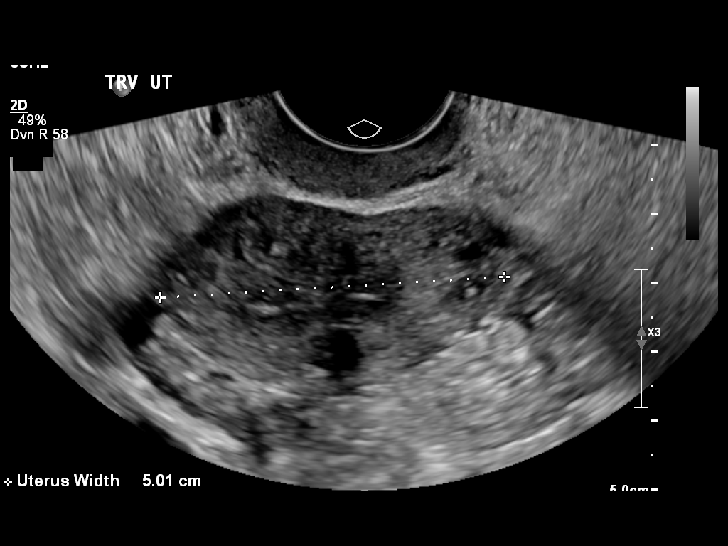
[im 14/22]
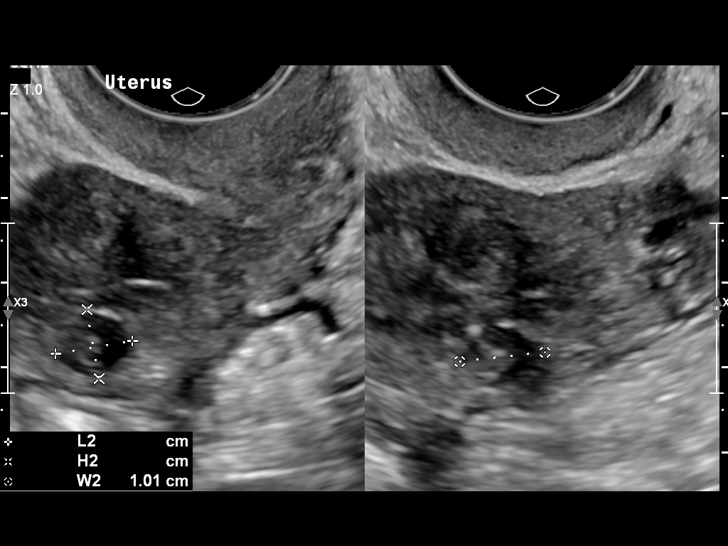
[im 15/22]
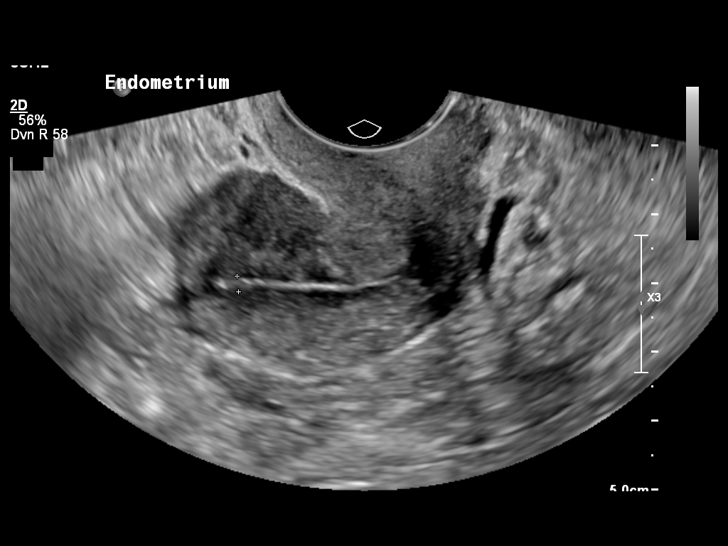
[im 17/22]
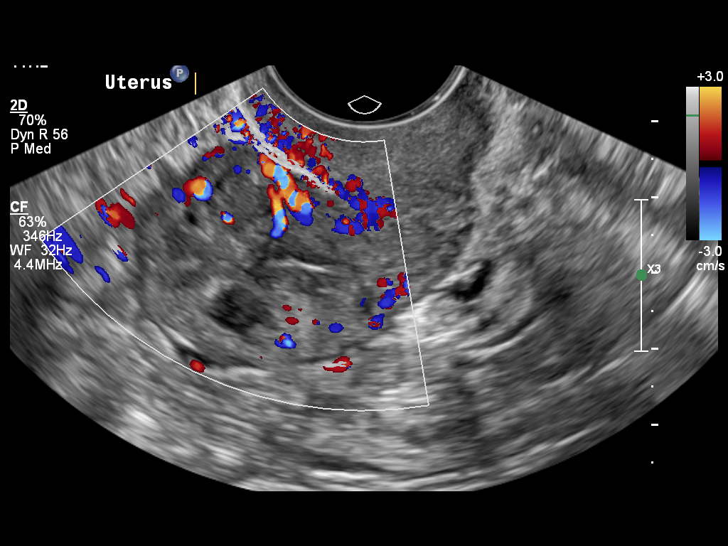
[im 19/22]
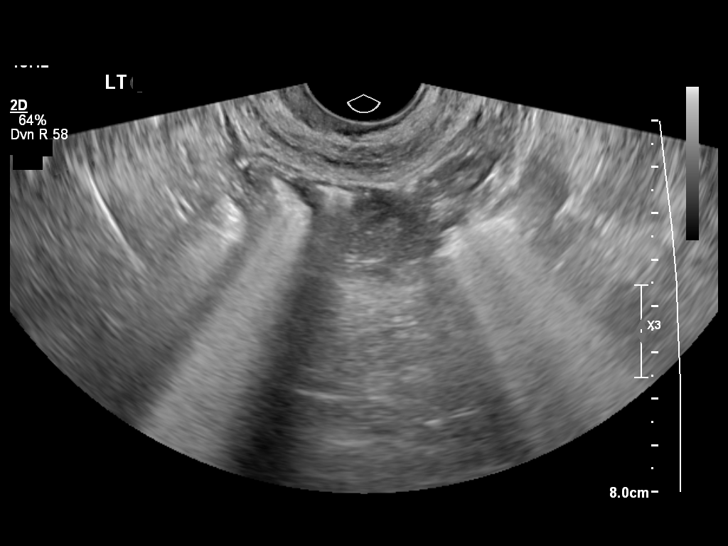
[im 20/22]
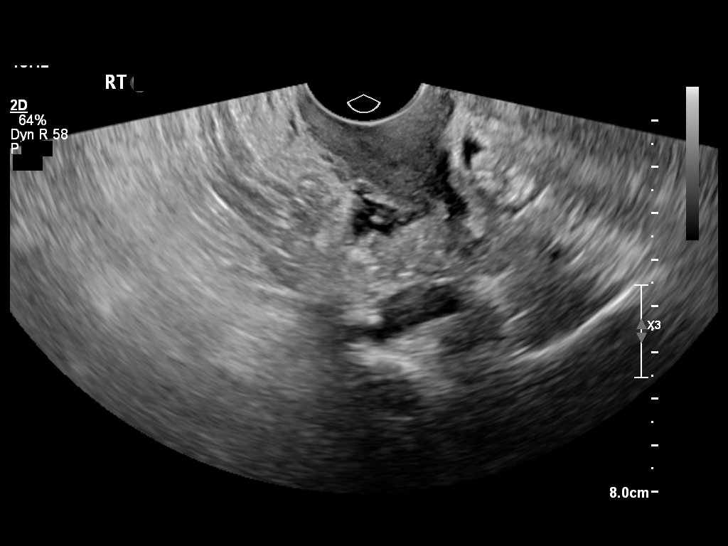
[im 22/22]
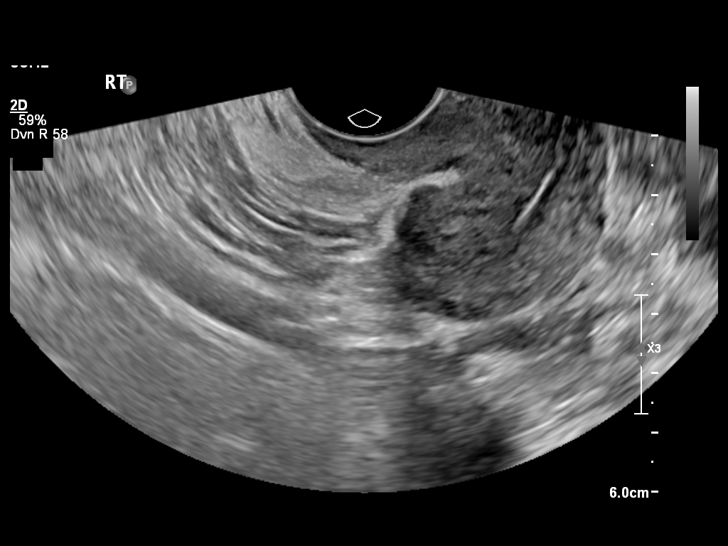

[14 of 22 positions shown; findings below may reference images not displayed]

## 2021-12-01 IMAGING — CT CT SOFT TISSUE NECK WITH CONTRAST
5 of 9 series · 13 of 33 positions shown, 14 images · IV contrast (agent unspecified)
Comparison: PET/CT from 07/27/2021

FINAL REPORT:
INDICATION: Malignant neoplasm of upper-outer quadrant of right female breast
Estrogen receptor positive status (ER+)
Exam: CT of the neck with IV Contrast
All CT scans at this facility use iterative reconstruction technique, dose modulation and/or weight based dosing when appropriate to reduce radiation dose to as low as reasonably achievable.

[Series 301: chest with · axial · 0.68mm/px · z∈[-303,-213]mm · 2 of 109 slices shown]
[im 37/109  bone]
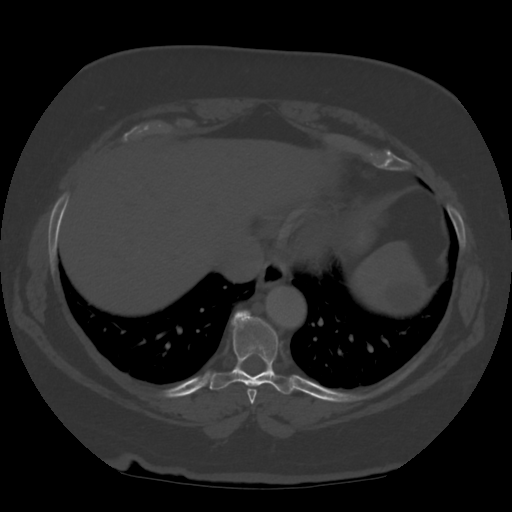
[im 73/109  bone]
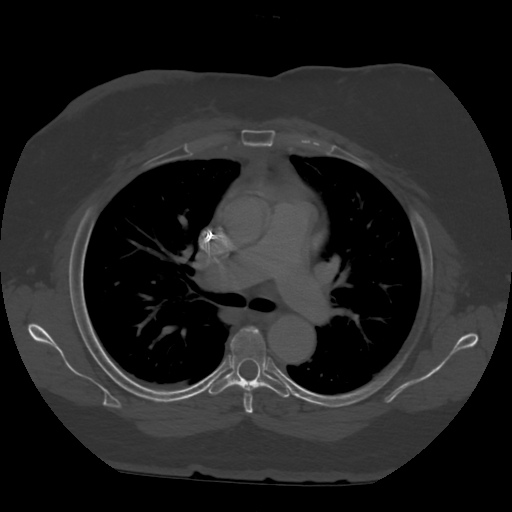

[Series 304: abd pel with · axial · 0.93mm/px · z∈[-615,-345]mm · 4 of 181 slices shown, 5 images]
[im 37/181  soft-tissue]
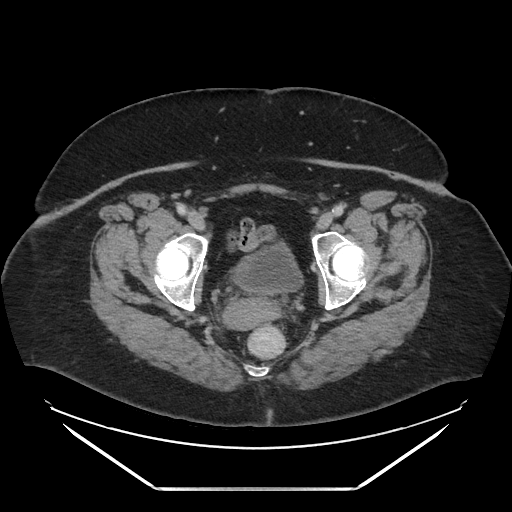
[im 37/181  bone]
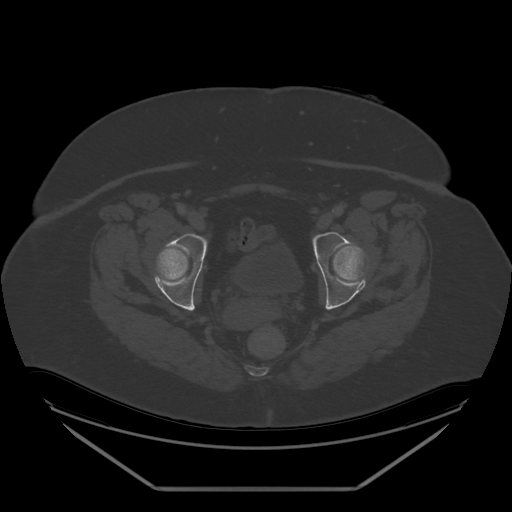
[im 73/181  bone]
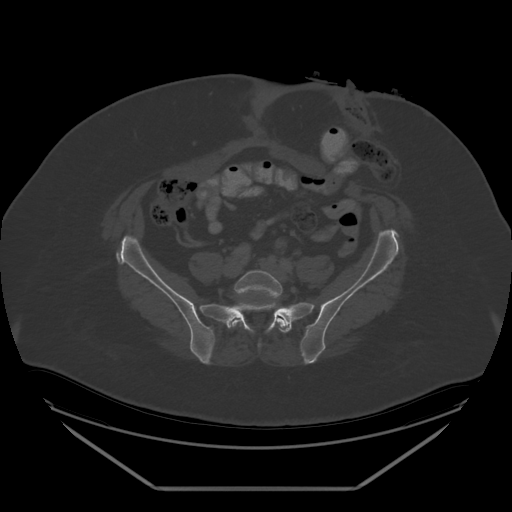
[im 109/181  bone]
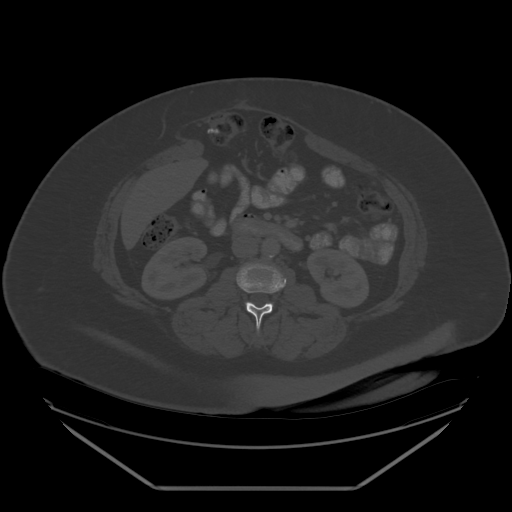
[im 145/181  bone]
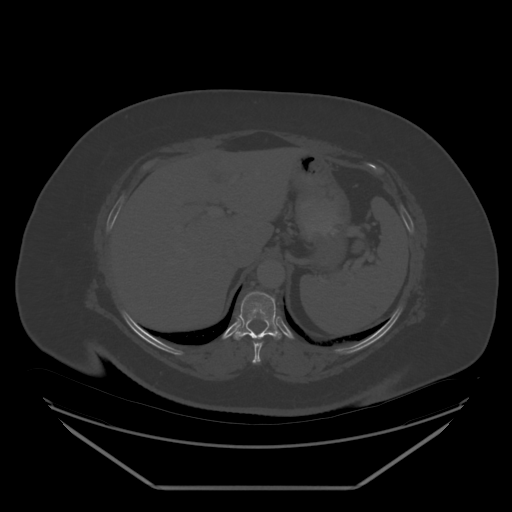

[Series 306: neck · axial · 0.48mm/px · z∈[-127,-44]mm · 2 of 100 slices shown]
[im 34/100  bone]
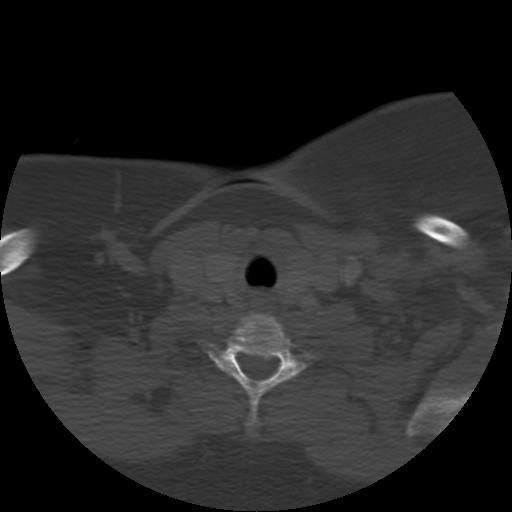
[im 67/100  bone]
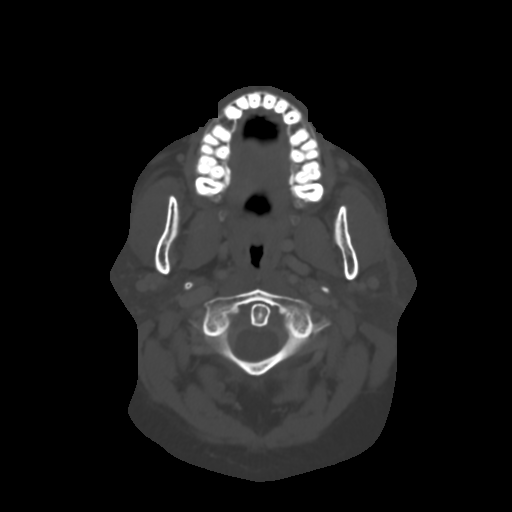

[Series 601: coronal · coronal · 0.68mm/px · 1 of 115 slices shown]
[im 58/115  bone]
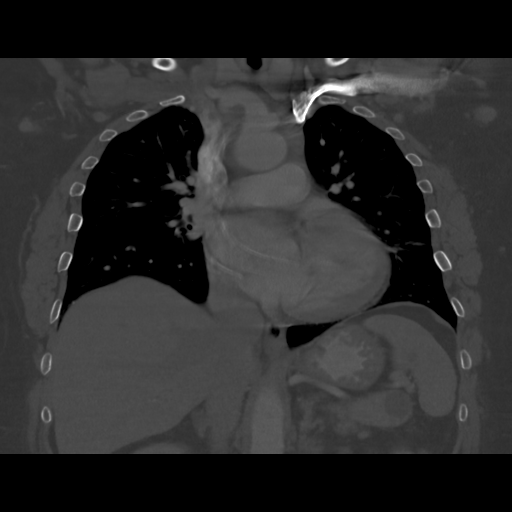

[Series 604: sagittal · sagittal · 0.93mm/px · 4 of 159 slices shown]
[im 32/159  bone]
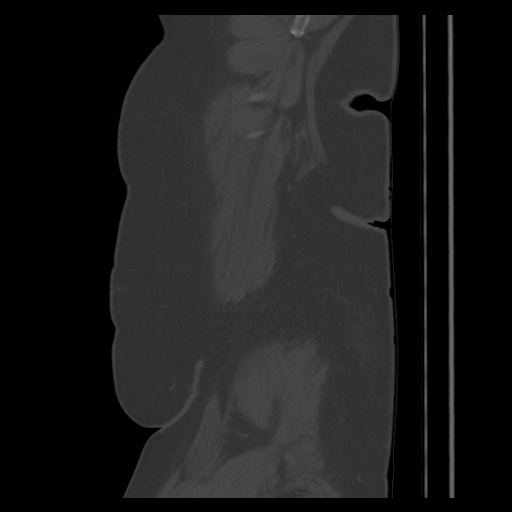
[im 64/159  bone]
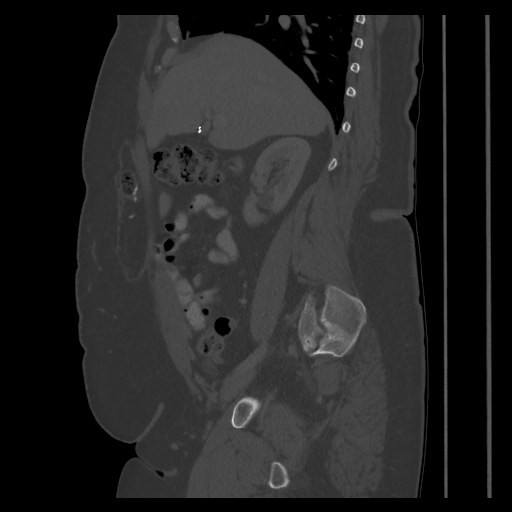
[im 95/159  bone]
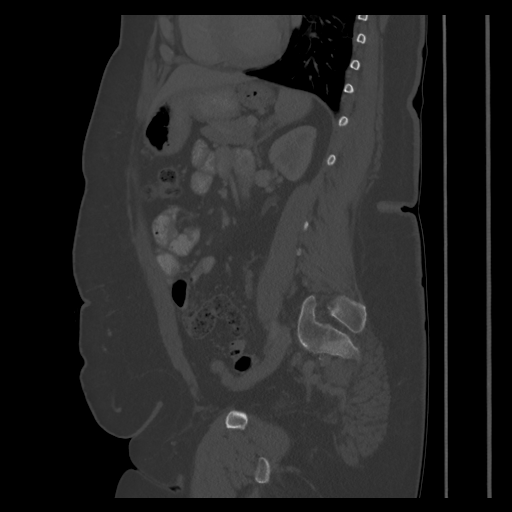
[im 127/159  bone]
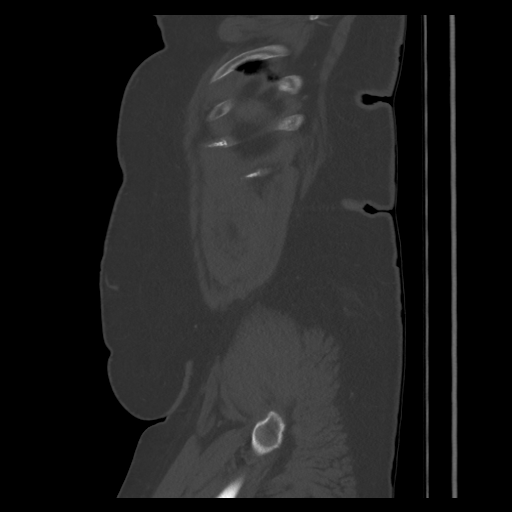

[13 of 33 positions shown; findings below may reference images not displayed]

FINDINGS: Visualized intracranial structures: Unremarkable.
Orbits, paranasal sinuses, and skull base: Unremarkable.
Nasopharynx: Unremarkable.
Suprahyoid neck: Unremarkable oropharynx, oral cavity, parapharyngeal space, and retropharyngeal space. Small bilateral intraparotid lymph nodes.
Infrahyoid neck: Normal larynx, hypopharynx, and supraglottis.
Thyroid: Unremarkable.
Thoracic inlet: Lung apices are unremarkable.
Lymph nodes: There is persistent left supraclavicular adenopathy with index node in the left supraclavicular region measuring 2.3 x 1.9 cm image 71 series 306 and measuring up to 1.0 x 0.7 cm image 57 series 306. Right intramammary node image 92 series 306 measured 1.3 x 1.0 cm, unchanged.
Vascular structures: Unremarkable.
Bones: Unremarkable
Other findings: None.
IMPRESSION: 
IMPRESSION: 1. Stable left supraclavicular and right internal mammary adenopathy when compared to PET/CT of 07/29/2021
Is the patient pregnant?
No

## 2021-12-29 IMAGING — PT TMC_SKULL_THIGH (Adult)
1 of 8 series · 2 of 16 positions shown, 3 images · non-contrast
Comparison: PET/CT from 07/29/2021 .

FINAL REPORT:
PET/CT examination
INDICATION: Right breast cancer.  Subsequent PET.
Procedure: A fused CT PET scan was performed from the skull base to the thighs after IV administration of 14.69 mCi of F.-18 FDG through the left antecubital IV. Axial images were acquired. Sagittal and coronal reconstructed images were obtained. The patient's blood glucose was 75mg/dL prior to the procedure.

[Series 4: ct wb · axial · 0.98mm/px · z∈[-737,-445]mm · 2 of 439 slices shown, 3 images]
[im 147/439  soft-tissue]
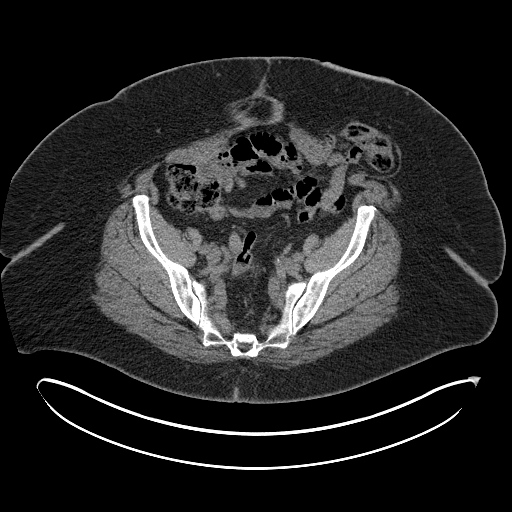
[im 147/439  bone]
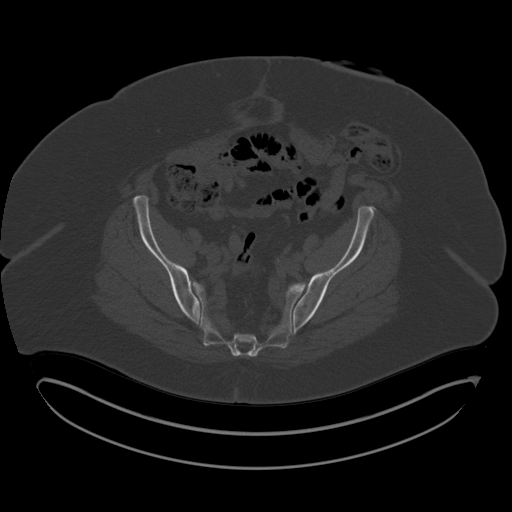
[im 293/439  soft-tissue]
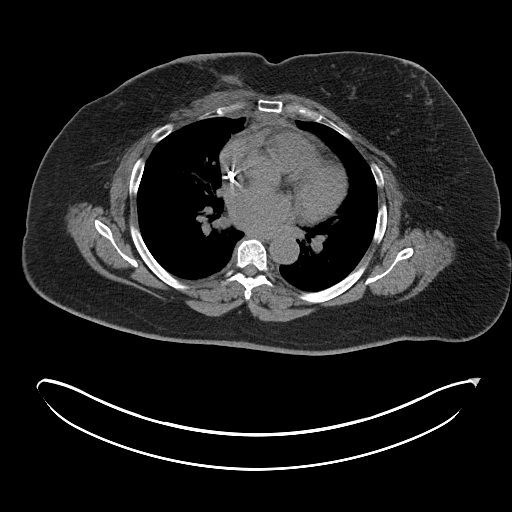

[2 of 16 positions shown; findings below may reference images not displayed]

FINDINGS: Baseline SUV activity within the liver is 4.0 and the mediastinal blood pool is 2.8.
Imaged intracranial contents: Unremarkable.
Soft tissues of the neck: Left supraclavicular lymph node measures 2.1 cm x 1.6 with a maximal SUV of 7.8, previously measuring 1.8 x 1.6 cm with a maximal SUV of 8.2. No new PET avid adenopathy in the neck.
Chest: No PET avid pulmonary nodule or mass.   There are several new PET avid axillary lymph nodes bilaterally. Left axillary lymph node measures 1.4 x 1.7 cm with a maximal SUV of 8.7.
There is worsening conglomerate anterior mediastinal adenopathy. PET avid prevascular and right paratracheal adenopathy has not significantly changed in size however there is a slight increase in elevated SUV activity. There is a new PET avid AP window lymph node.
There is increased SUV activity within the medial aspect of the right pectoralis major muscle without a discernible mass on the CT images. This could be secondary to posttreatment changes.
Abdomen: No suspicious areas of elevated SUV activity in the liver, spleen, adrenals, pancreas, and kidneys.   No PET avid adenopathy in the abdomen.
Pelvis: No PET avid adenopathy or soft tissue mass in the pelvis. No significant free fluid in the abdomen or pelvis.
Bones: No suspicious areas of elevated SUV activity in the spine or the imaged osseous structures.
IMPRESSION: 
IMPRESSION: 1.  Worsening PET avid adenopathy in the chest as described above.
2. No suspicious areas of elevated SUV activity in the abdomen or pelvis.
3. No significant interval change of the PET avid left supraclavicular lymph node.
All CT scans at this facility use iterative reconstruction technique, dose modulation and/or weight based dosing when appropriate to reduce radiation dose to as low as reasonably achievable.
Is the patient pregnant?
Unknown

## 2022-01-14 IMAGING — MR MRI BRAIN WITH AND WITHOUT CONTRAST
15 series · 48 of 48 positions shown · IV contrast (PROHANCE)
Comparison: None available

CANCER STAGING, PT STARTED A NEW CHEMO ON TUESDAY HAVING SEVERE HEADACHES
FINAL REPORT:
INDICATION: Malignant neoplasm of upper-outer quadrant of right female breast
Estrogen receptor positive status (ER+)
Exam: MRI of the head without IV Contrast

[Series 2001: survey_mpr_sag · sagittal · 1.6mm · 1.60mm/px · 1 of 5 slices shown]
[im 1/5]
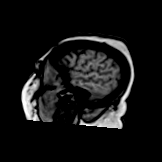

[Series 3001: survey_mpr_cor · coronal · 1.6mm · 1.60mm/px · 1 of 3 slices shown]
[im 1/3]
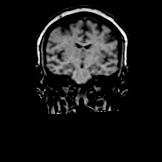

[Series 4001: survey_mpr_tra · axial · 1.6mm · 1.60mm/px · 1 of 3 slices shown]
[im 1/3]
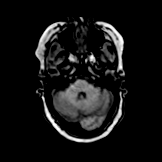

[Series 5001: T1 · sagittal · 5.0mm · 0.40mm/px · 1 of 27 slices shown (1 of 2)]
[im 1/27]
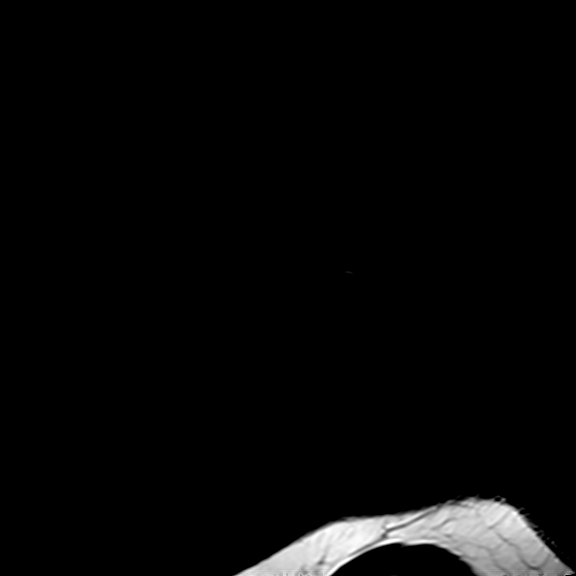

[Series 6002: ax dwi_tracew · axial · 5.0mm · 0.60mm/px · 1 of 30 slices shown]
[im 1/30]
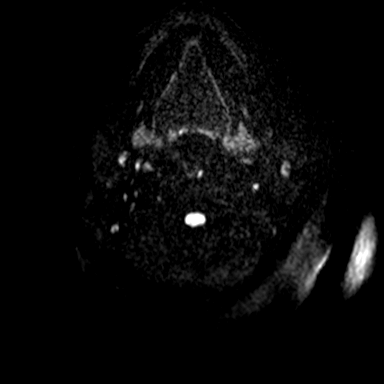

[Series 7001: ax dwi_adc · axial · 5.0mm · 0.60mm/px · 1 of 30 slices shown]
[im 1/30]
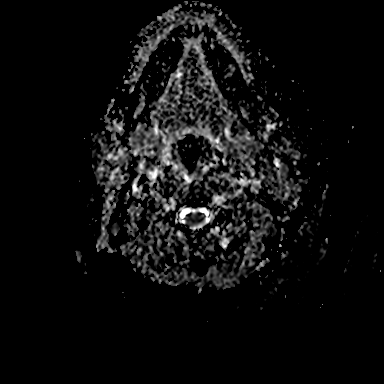

[Series 8001: ax dwi_exp · axial · 5.0mm · 0.60mm/px · 1 of 30 slices shown]
[im 1/30]
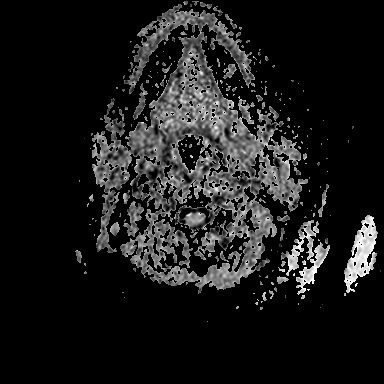

[Series 9001: T2 · axial · 5.0mm · 0.51mm/px · z∈[-118,+55]mm · 2 of 30 slices shown]
[im 1/30]
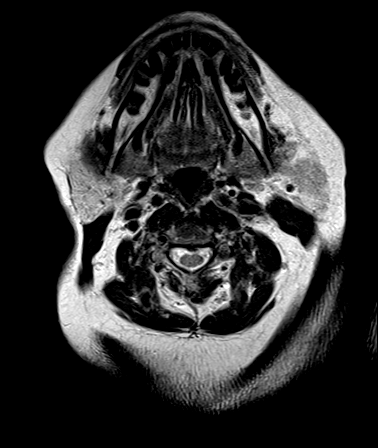
[im 30/30]
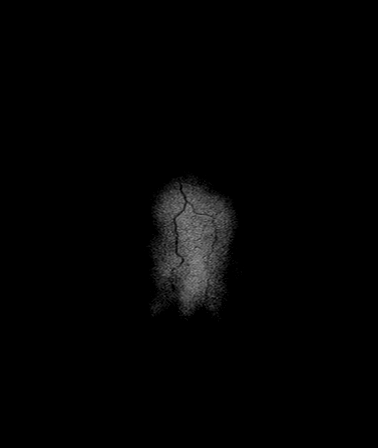

[T1 · axial · 5.0mm · 0.72mm/px · z∈[-118,+55]mm · 2 of 30 slices shown (2 of 2)]
[im 1/30]
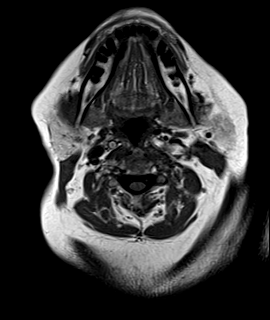
[im 30/30]
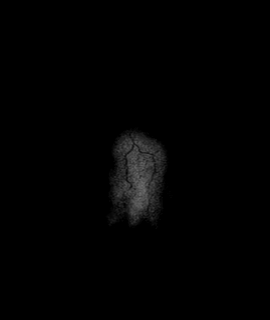

[FLAIR · axial · 5.0mm · 0.72mm/px · z∈[-118,+55]mm · 2 of 30 slices shown]
[im 1/30]
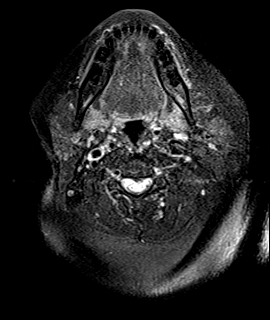
[im 30/30]
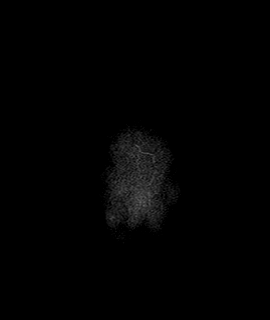

[GRE · axial · 5.0mm · 0.45mm/px · z∈[-118,+55]mm · 2 of 30 slices shown]
[im 1/30]
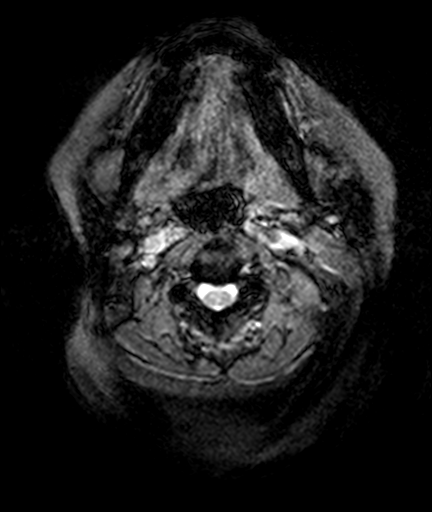
[im 30/30]
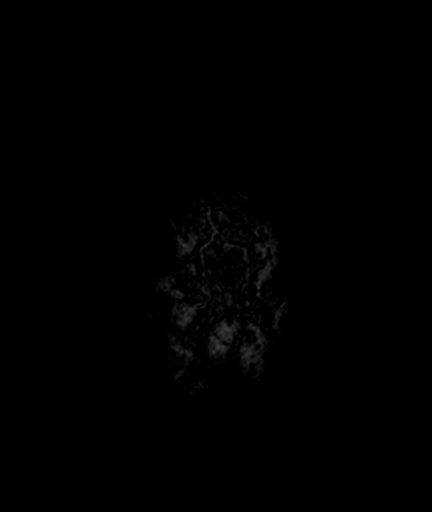

[T1 post-contrast · axial · 5.0mm · 0.72mm/px · z∈[-118,+55]mm · 2 of 30 slices shown]
[im 1/30]
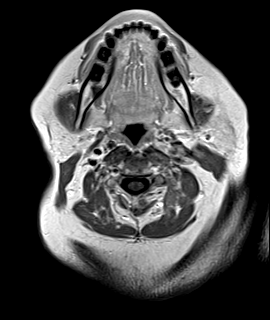
[im 30/30]
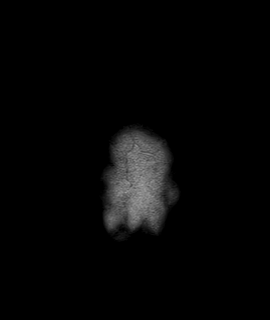

[t1_mprage_tra_p2_iso_1.0 post · axial · 1.0mm · 0.97mm/px · z∈[-91,+66]mm · 10 of 160 slices shown]
[im 1/160]
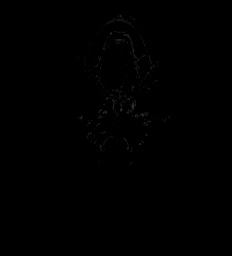
[im 18/160]
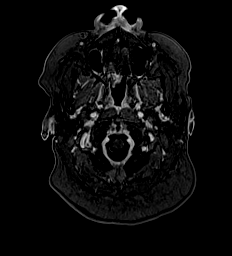
[im 36/160]
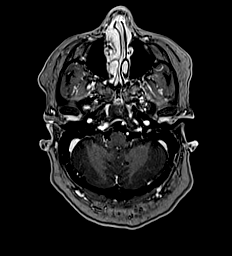
[im 54/160]
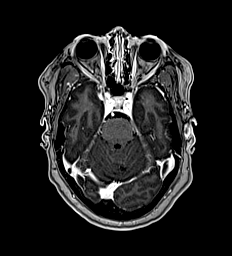
[im 71/160]
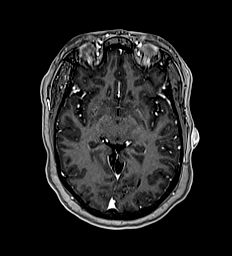
[im 89/160]
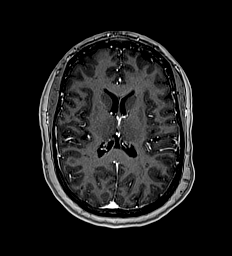
[im 107/160]
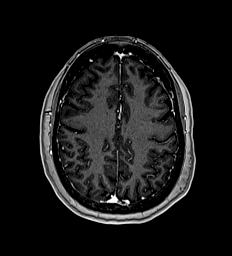
[im 124/160]
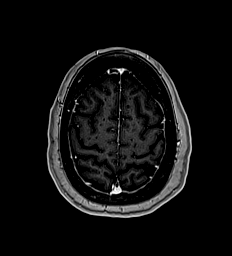
[im 142/160]
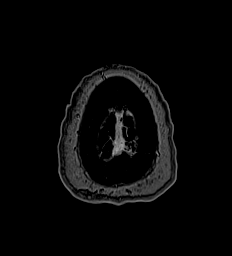
[im 160/160]
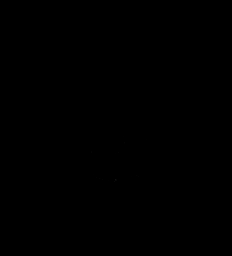

[t1_mprage_tra_p2_iso_1.0 post_mpr_sag · sagittal · 1.0mm · 0.97mm/px · 9 of 148 slices shown]
[im 1/148]
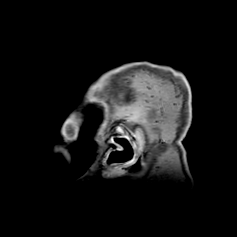
[im 19/148]
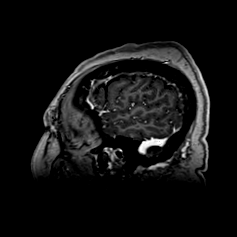
[im 37/148]
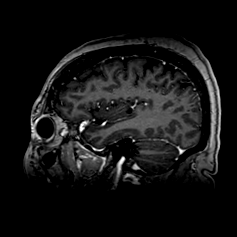
[im 56/148]
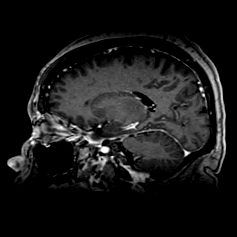
[im 74/148]
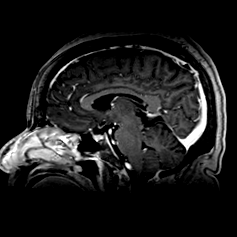
[im 92/148]
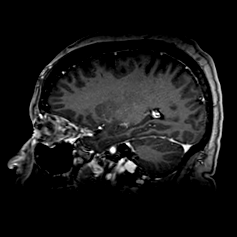
[im 111/148]
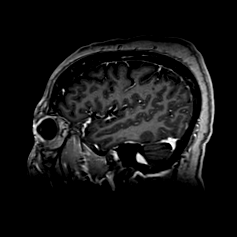
[im 129/148]
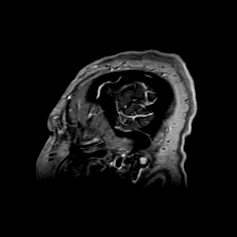
[im 148/148]
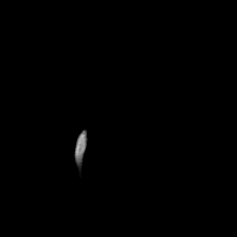

[t1_mprage_tra_p2_iso_1.0 post_mpr_cor · coronal · 1.0mm · 0.97mm/px · 12 of 200 slices shown]
[im 1/200]
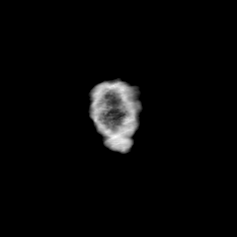
[im 19/200]
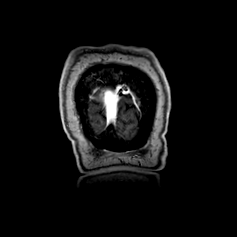
[im 37/200]
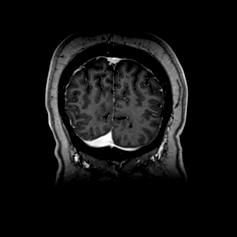
[im 55/200]
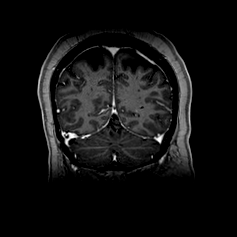
[im 73/200]
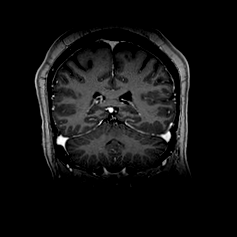
[im 91/200]
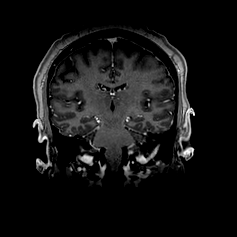
[im 109/200]
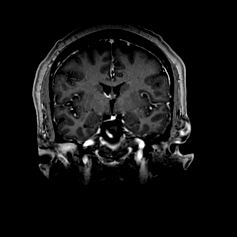
[im 127/200]
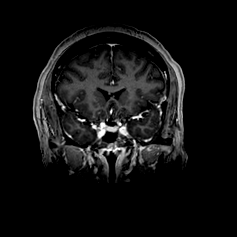
[im 145/200]
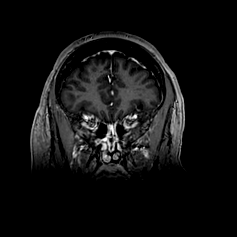
[im 163/200]
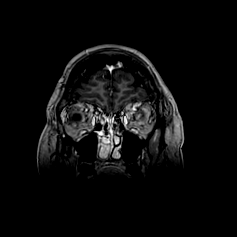
[im 181/200]
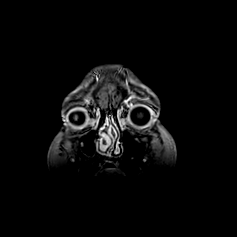
[im 200/200]
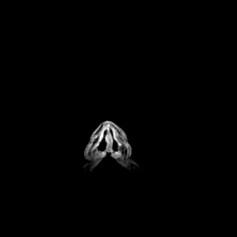

[48 of 48 positions shown; findings below may reference images not displayed]

FINDINGS: Extra-axial spaces: Unremarkable
Intracranial hemorrhage: None.
Ventricular system: The ventricles are not significantly dilated or effaced.
Basal cisterns: Patent
Cerebral parenchyma: Nonspecific T2/Flair hyperintensities are present within the periventricular and subcortical white matter in both cerebral hemispheres.
Midline shift: None.
Vascular system: Normal.
Paranasal sinuses and mastoid air cells: Clear.
Visualized orbits: Unremarkable.
Sella: Unremarkable.
Calvarium: Normal.
IMPRESSION: 
IMPRESSION: 1. Chronic small vessel ischemic changes without acute intracranial abnormality.
Is the patient pregnant?
Unknown

## 2022-01-22 IMAGING — DX XR CHEST 1 VIEW
1 series · 1 of 1 positions shown · non-contrast
Comparison: None

FINAL REPORT:
Chest portable one view
INDICATION: SOB

[AP]
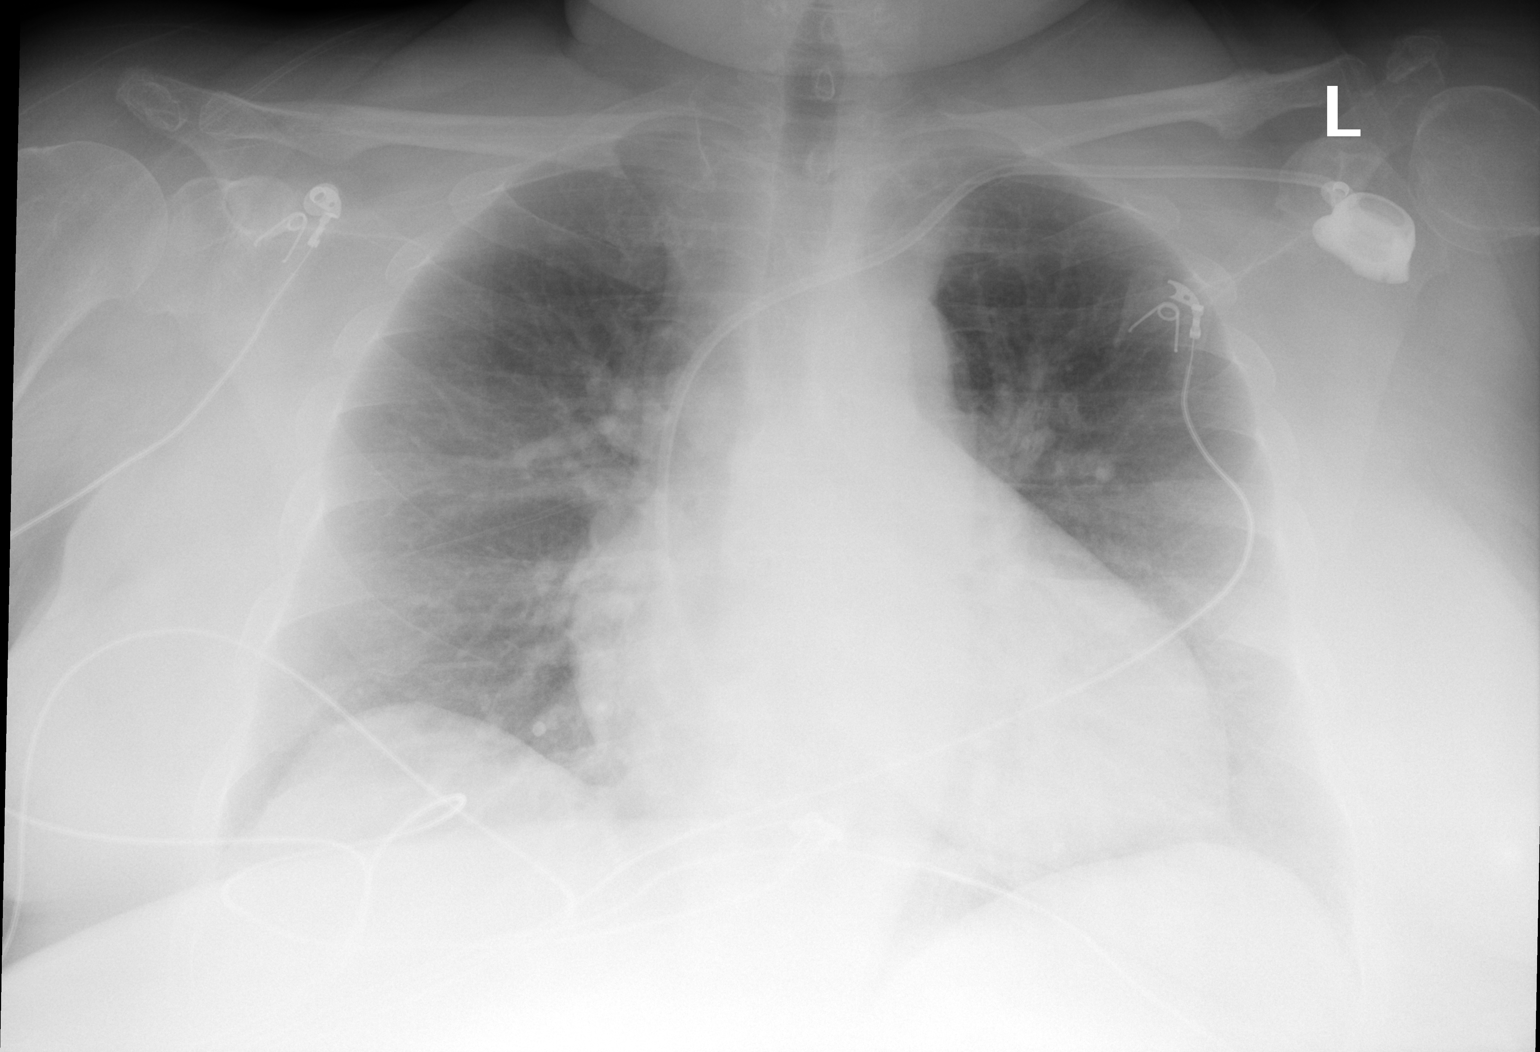

[1 of 1 positions shown; findings below may reference images not displayed]

FINDINGS: Left subclavian port terminating in the region of the right atrium.  The cardiac silhouette is mildly enlarged.  The lungs are clear.   No pneumothorax. No definite pleural effusion.   No acute osseous abnormalities.
IMPRESSION: 
IMPRESSION: Mild cardiomegaly without acute acute findings.
Is the patient pregnant?
No

## 2022-01-22 IMAGING — US US LOW EXT VEINS LEFT
1 series · 14 of 16 positions shown · non-contrast
Comparison: None
Technique and Findings: Real time gray scale, color, and spectral Doppler imaging of the left common femoral, femoral, profunda femoral, great saphenous, popliteal, peroneal, and posterior tibial veins is performed.

FINAL REPORT:
DOPPLER ULTRASOUND, LEFT LOWER EXTREMITY:
CLINICAL HISTORY: Left lower extremity swelling, history of CA Pain, swelling

[Series 1: us low ext veins left · 14 of 20 slices shown]
[im 1/20]
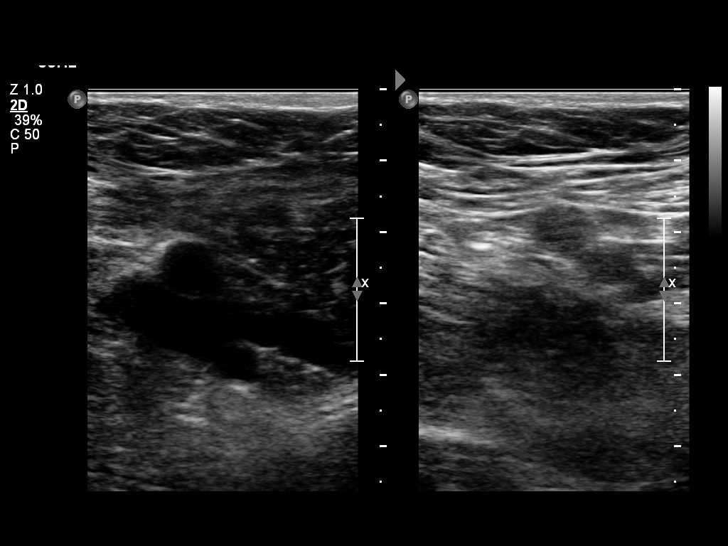
[im 2/20]
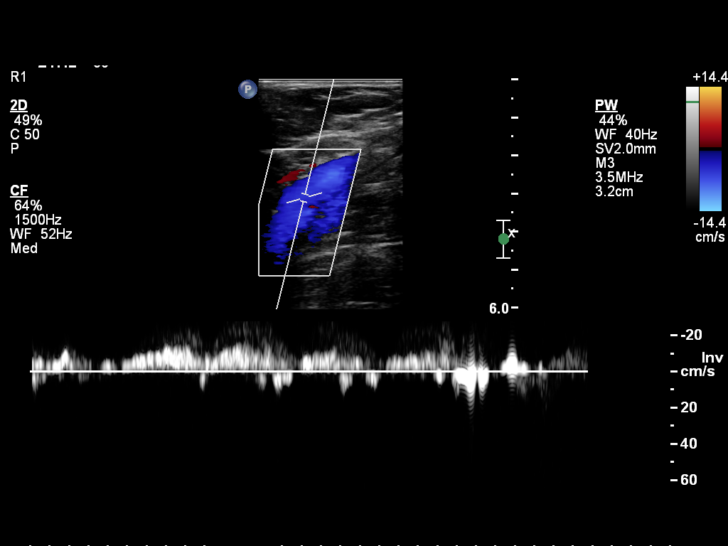
[im 3/20]
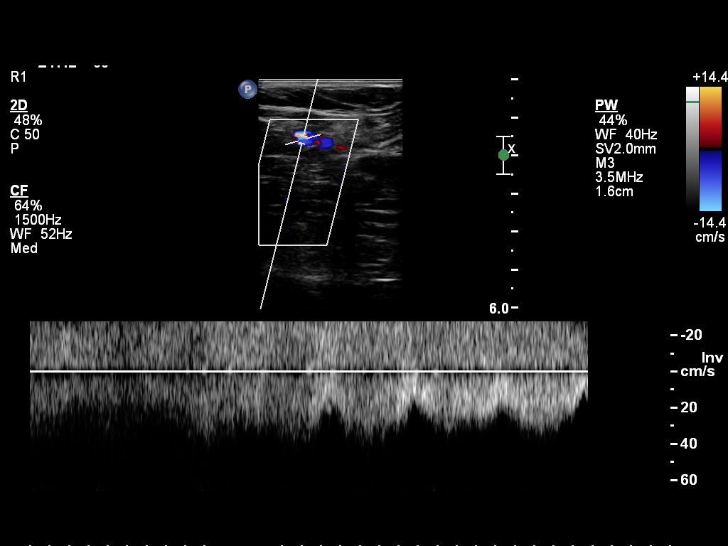
[im 6/20]
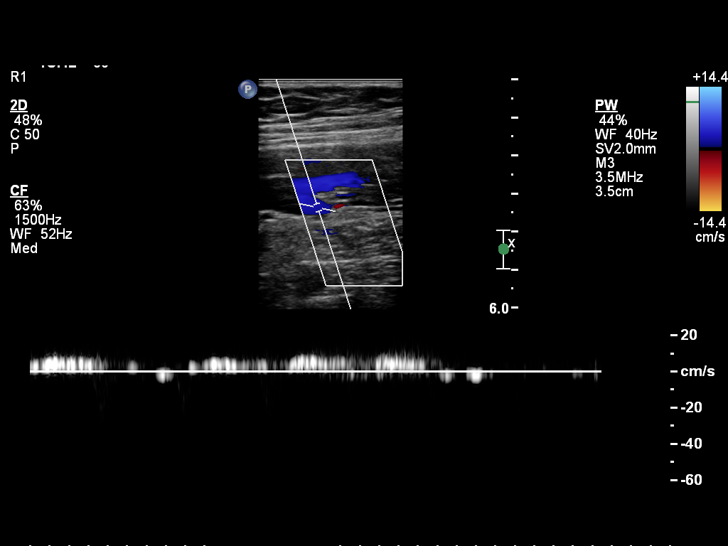
[im 7/20]
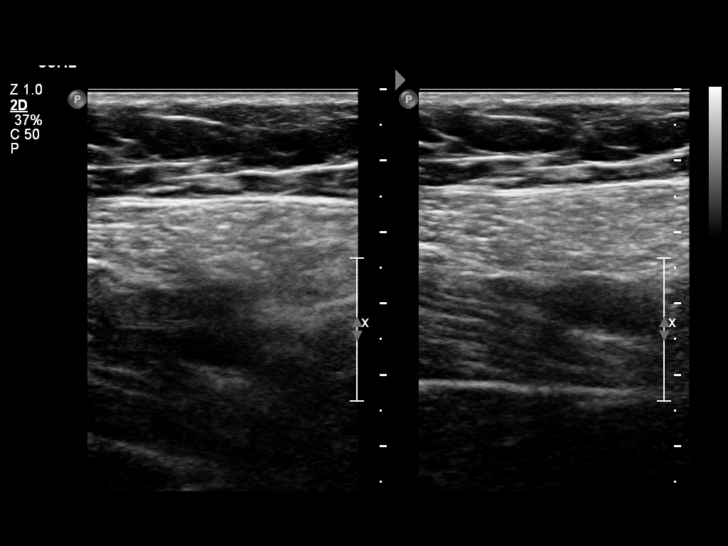
[im 8/20]
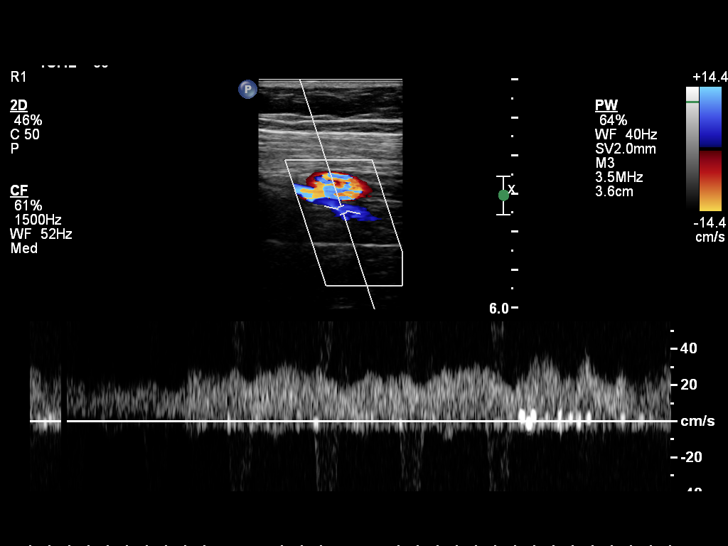
[im 9/20]
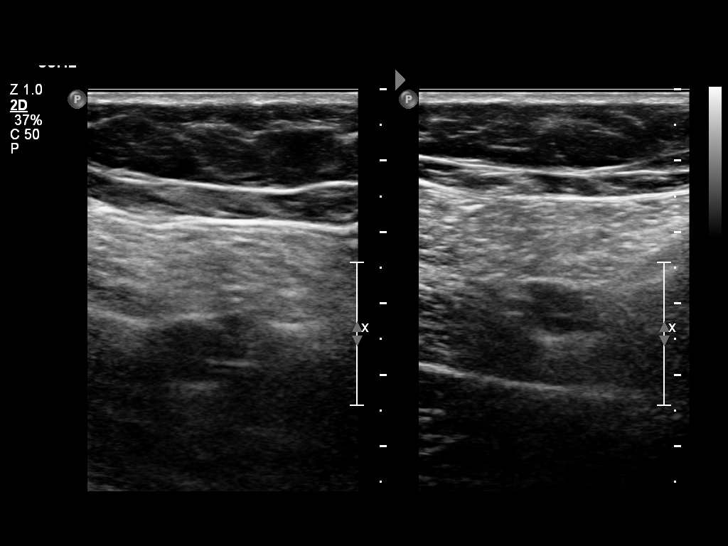
[im 11/20]
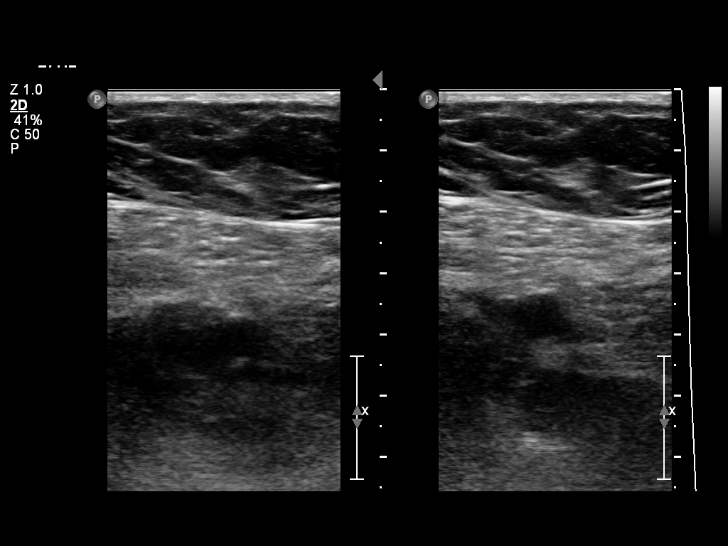
[im 12/20]
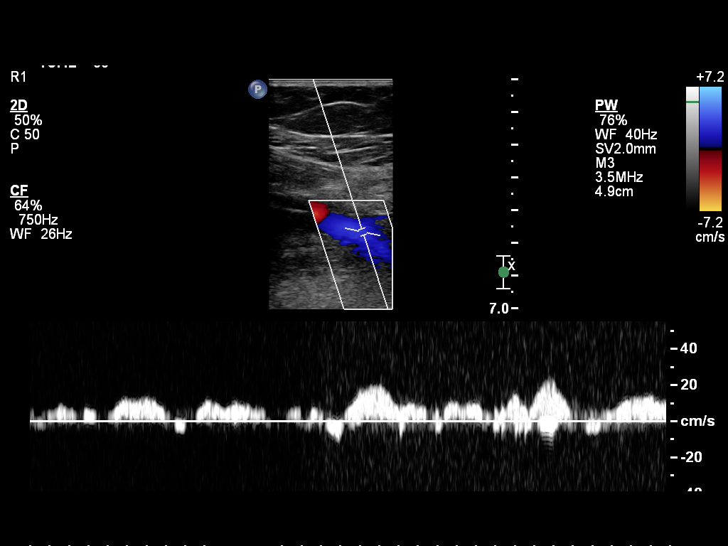
[im 13/20]
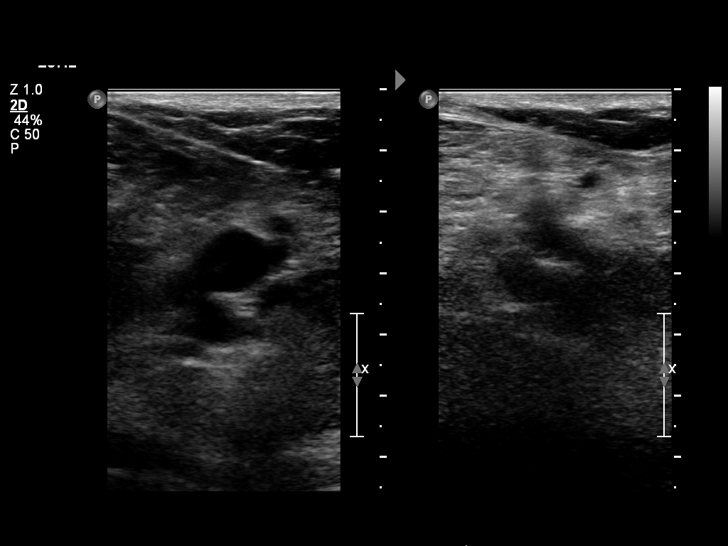
[im 16/20]
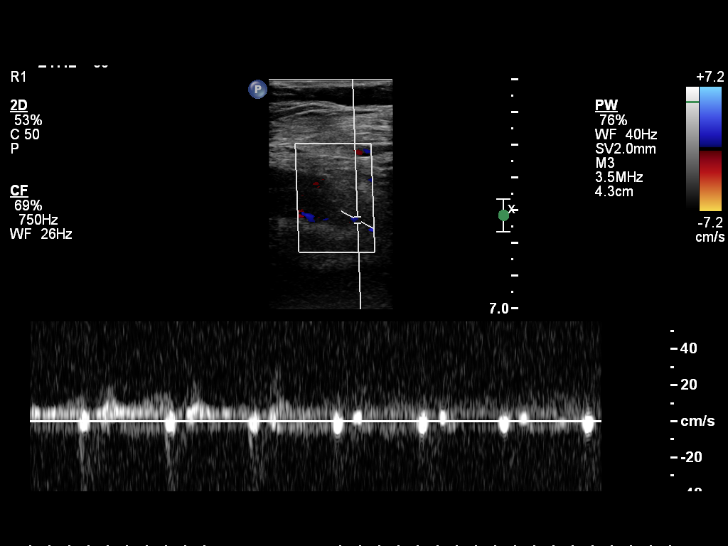
[im 17/20]
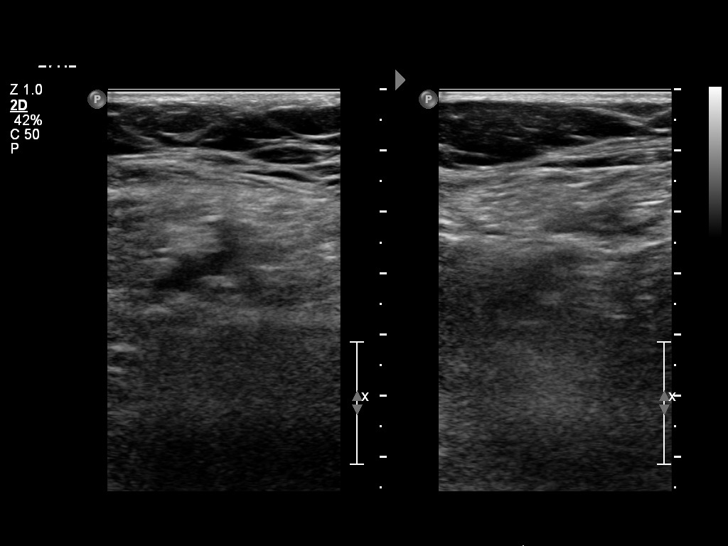
[im 18/20]
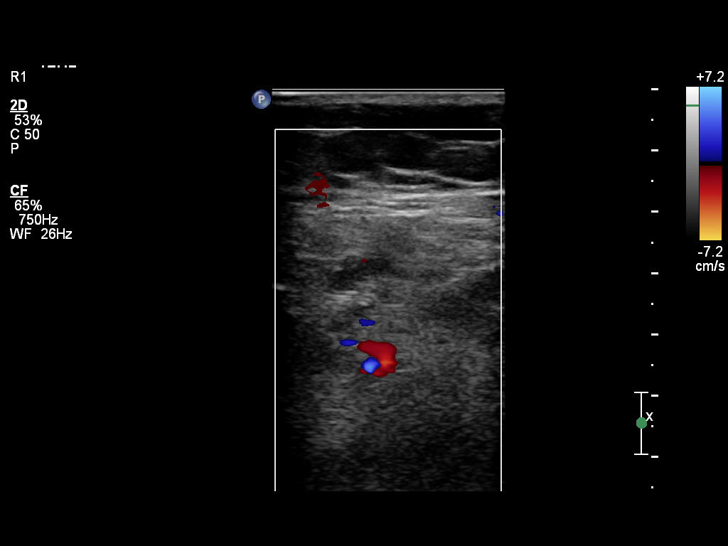
[im 20/20]
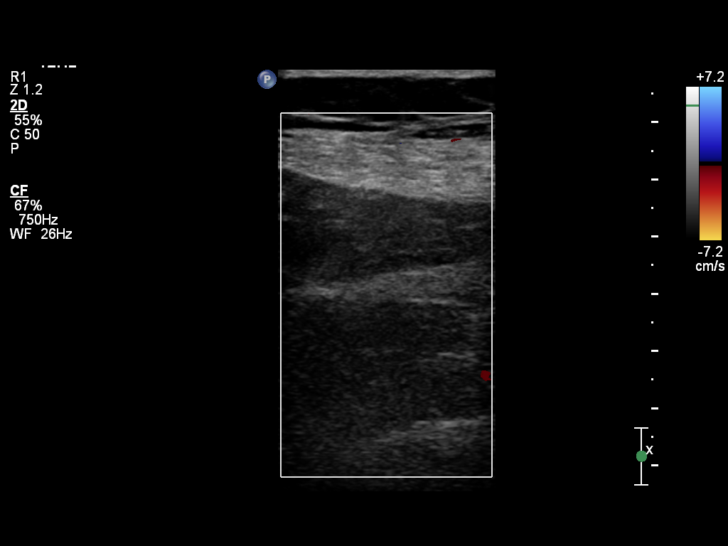

[14 of 16 positions shown; findings below may reference images not displayed]

There is no intraluminal thrombus. The veins are compressible throughout.  There is normal respiratory variation and augmentation of flow.
IMPRESSION: No sonographic evidence of DVT in the left lower.
Is the patient pregnant?
No

## 2022-03-03 IMAGING — MG MAMMO BREAST SCREENING LEFT
6 of 9 series · 6 of 21 positions shown · non-contrast
Comparison: [id number]

This is a summary report. The complete report is available in the patient's medical record. If you cannot access the medical record, please contact the sending organization for a detailed fax or copy.
FINAL REPORT:
EXAM: MAMMO BREAST SCREENING TOMOSYNTHESIS 3D LEFT
CLINICAL HISTORY: Screening mammogram. History of right mastectomy for breast cancer, and bilateral breast reduction
TECHNIQUE: 2-D/3-D digital mammography was performed with CAD of the left breast. Routine views were obtained.

[L CC]
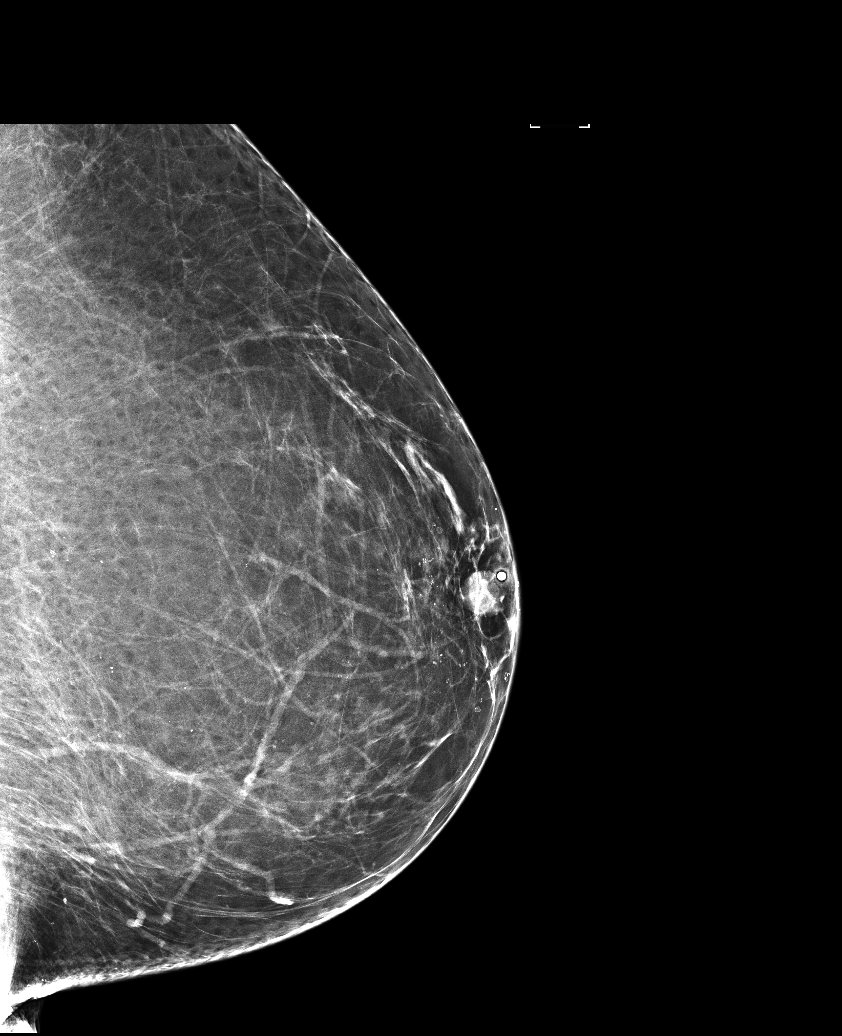

[L XCCL synth-2D]
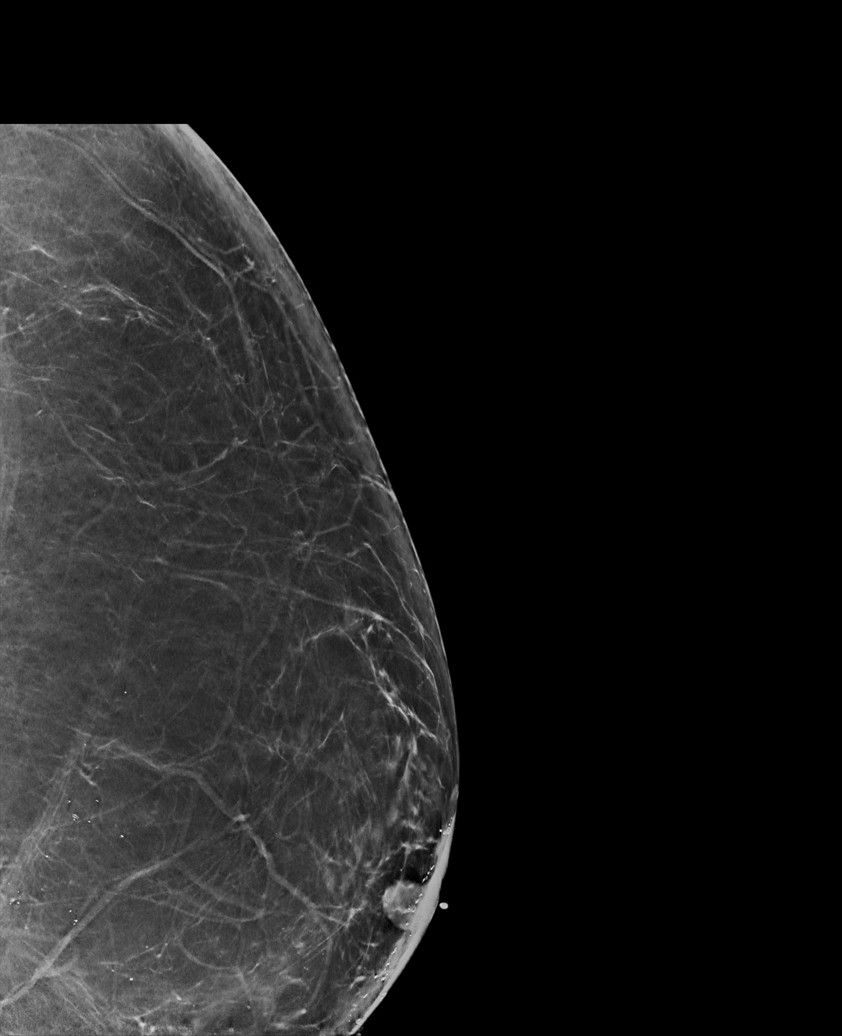

[L CC synth-2D]
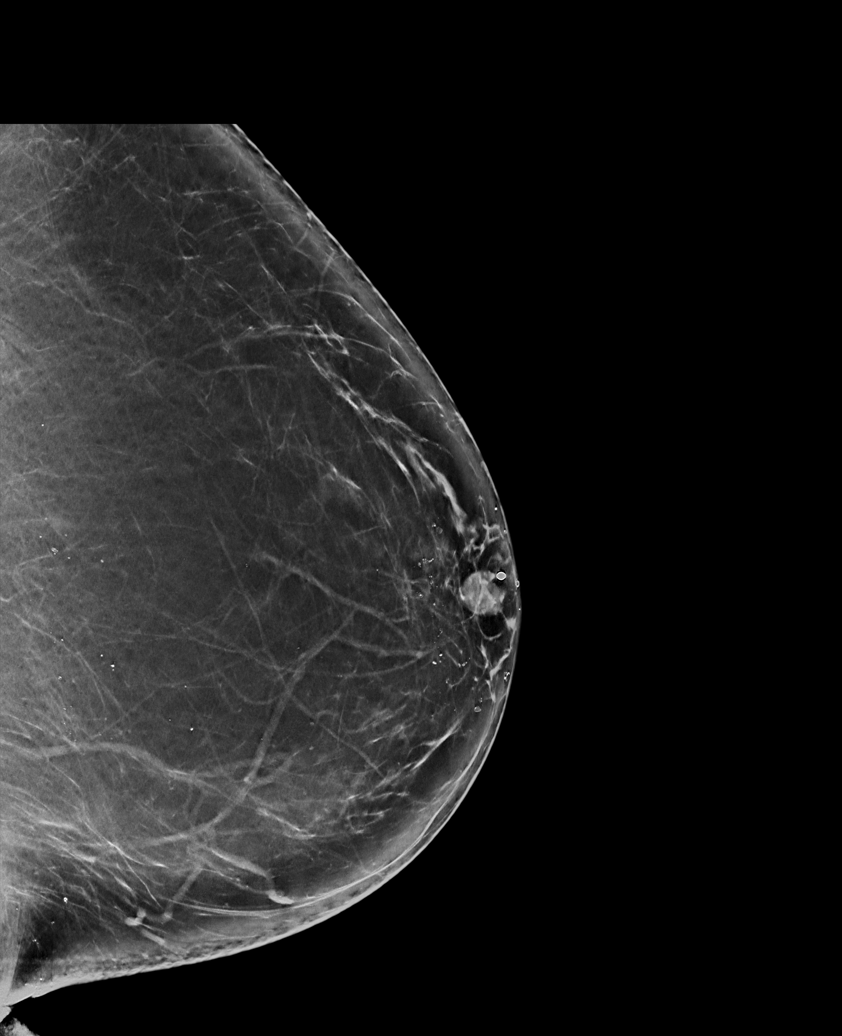

[L MLO synth-2D]
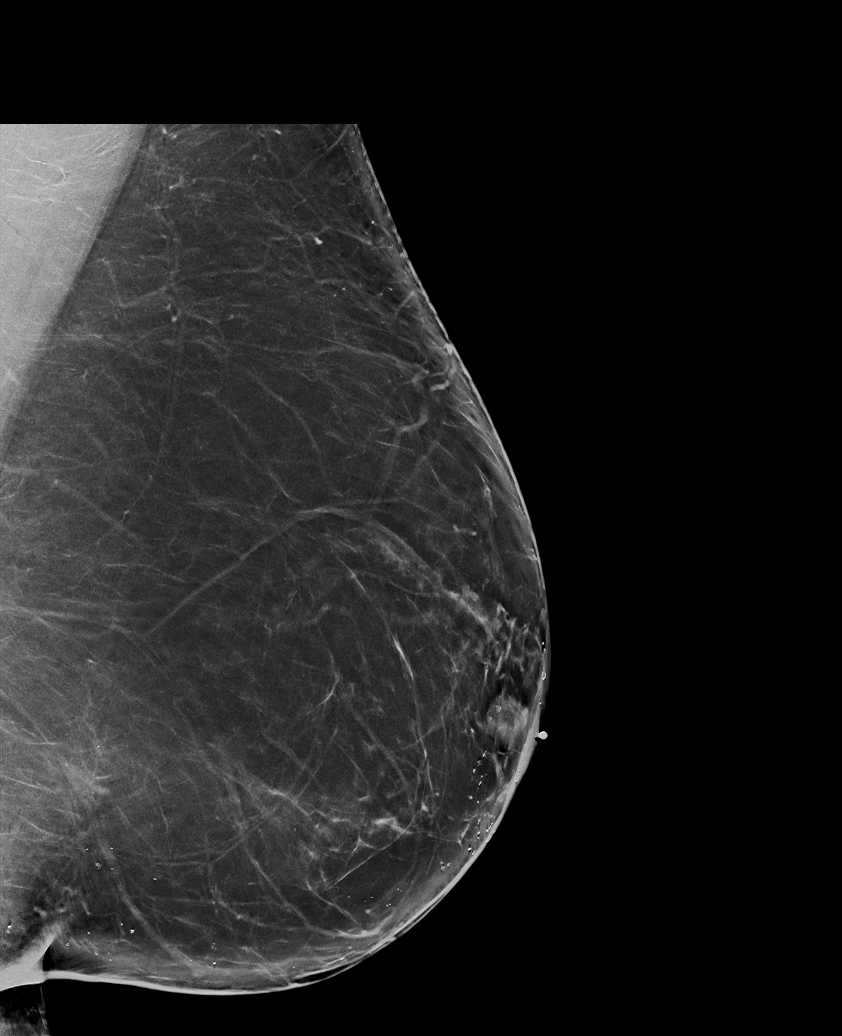

[L MLO]
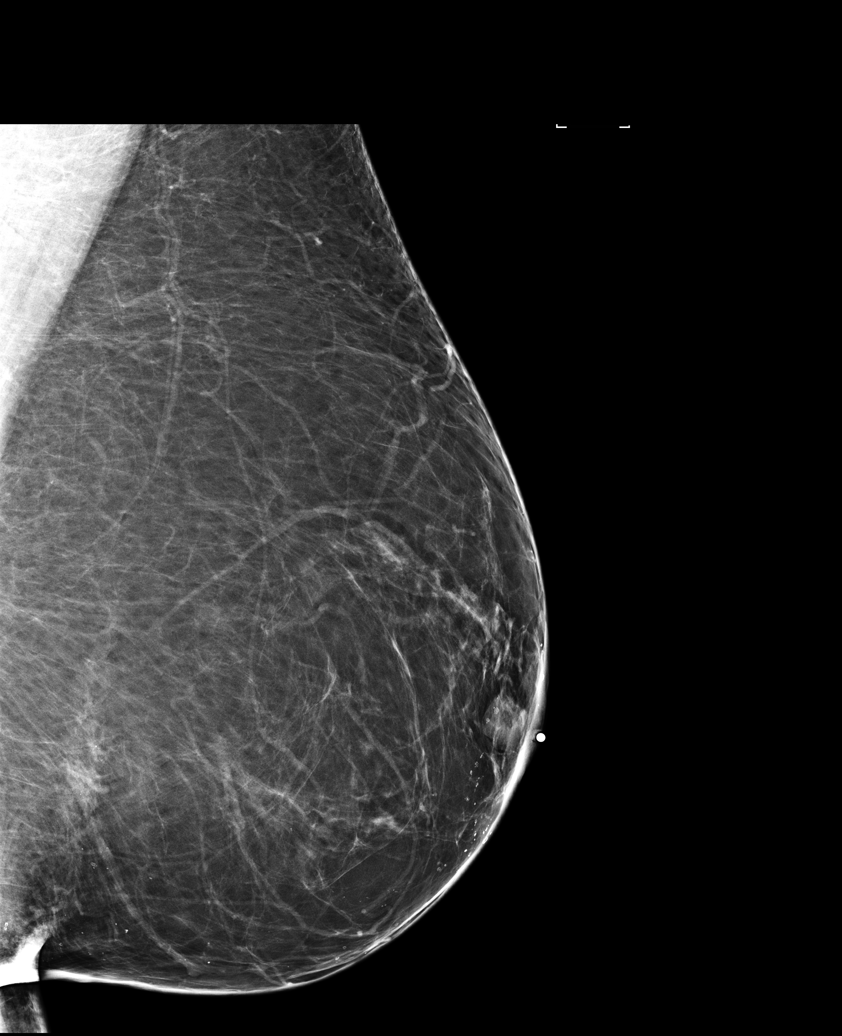

[L XCCL]
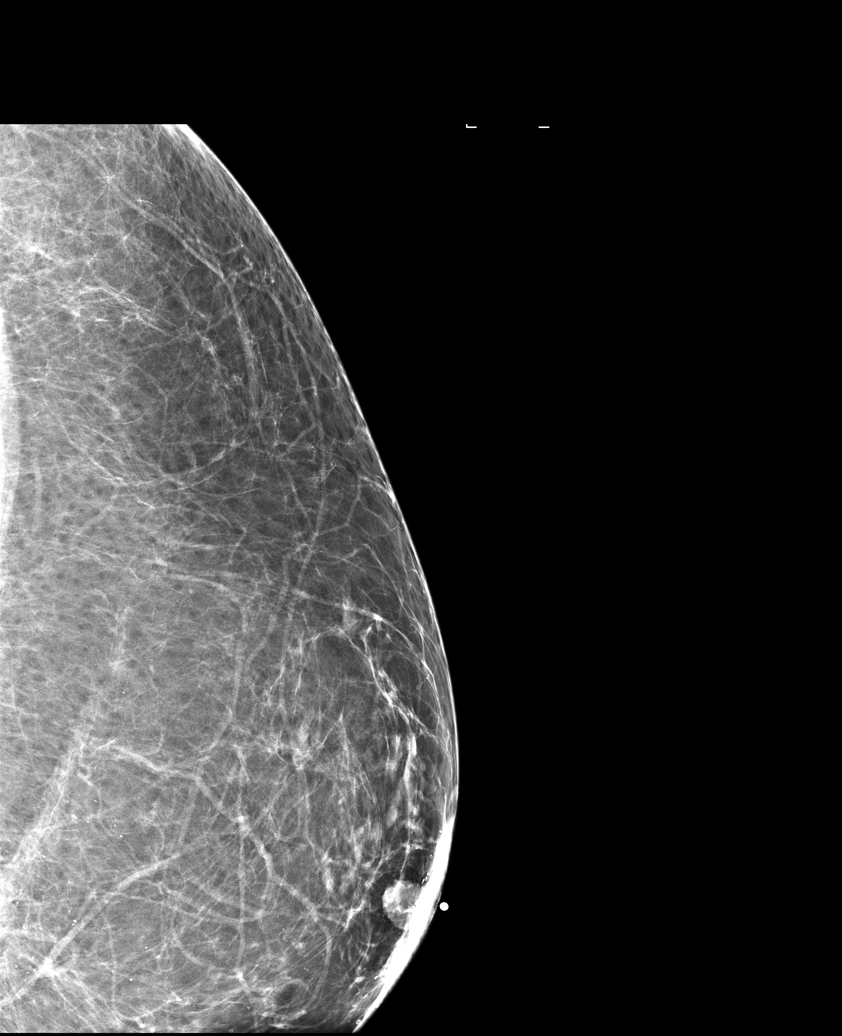

[6 of 21 positions shown; findings below may reference images not displayed]

FINDINGS: No suspicious masses, suspicious microcalcifications, or unexplained distortion. Mediport partially obscures evaluation of the left axilla
IMPRESSION: No evidence of malignancy.
BI-RADS Category 2: Benign
Breast Density B: There are scattered areas of fibroglandular density
Recommendation: Annual screening
Is the patient pregnant?
No
